# Patient Record
Sex: Female | Born: 1945 | Race: Black or African American | Hispanic: No | Marital: Single | Smoking: Never smoker
Health system: Southern US, Community
[De-identification: ages and names within clinical notes are randomized; demographics above are authoritative.]

## PROBLEM LIST (undated history)

## (undated) DIAGNOSIS — F0390 Unspecified dementia without behavioral disturbance: Secondary | ICD-10-CM

---

## 2019-03-07 ENCOUNTER — Emergency Department (HOSPITAL_COMMUNITY): Payer: Medicare Other

## 2019-03-07 ENCOUNTER — Encounter (HOSPITAL_COMMUNITY): Payer: Self-pay | Admitting: Emergency Medicine

## 2019-03-07 ENCOUNTER — Other Ambulatory Visit: Payer: Self-pay

## 2019-03-07 ENCOUNTER — Inpatient Hospital Stay (HOSPITAL_COMMUNITY)
Admission: EM | Admit: 2019-03-07 | Discharge: 2019-03-12 | DRG: 177 | Disposition: A | Discharge status: Skilled Nursing Facility | Payer: Medicare Other | Attending: Internal Medicine | Admitting: Internal Medicine

## 2019-03-07 DIAGNOSIS — U071 COVID-19: Principal | ICD-10-CM | POA: Diagnosis present

## 2019-03-07 DIAGNOSIS — J1282 Pneumonia due to coronavirus disease 2019: Secondary | ICD-10-CM | POA: Diagnosis present

## 2019-03-07 DIAGNOSIS — F0390 Unspecified dementia without behavioral disturbance: Secondary | ICD-10-CM | POA: Diagnosis present

## 2019-03-07 DIAGNOSIS — R531 Weakness: Secondary | ICD-10-CM | POA: Diagnosis not present

## 2019-03-07 DIAGNOSIS — G9341 Metabolic encephalopathy: Secondary | ICD-10-CM | POA: Diagnosis present

## 2019-03-07 DIAGNOSIS — G9340 Encephalopathy, unspecified: Secondary | ICD-10-CM | POA: Diagnosis present

## 2019-03-07 DIAGNOSIS — E860 Dehydration: Secondary | ICD-10-CM | POA: Diagnosis present

## 2019-03-07 HISTORY — DX: Unspecified dementia, unspecified severity, without behavioral disturbance, psychotic disturbance, mood disturbance, and anxiety: F03.90

## 2019-03-07 LAB — BASIC METABOLIC PANEL
Anion gap: 10 (ref 5–15)
BUN: 27 mg/dL — ABNORMAL HIGH (ref 8–23)
CO2: 22 mmol/L (ref 22–32)
Calcium: 8.5 mg/dL — ABNORMAL LOW (ref 8.9–10.3)
Chloride: 106 mmol/L (ref 98–111)
Creatinine, Ser: 1.19 mg/dL — ABNORMAL HIGH (ref 0.44–1.00)
GFR calc Af Amer: 53 mL/min — ABNORMAL LOW (ref >60)
GFR calc non Af Amer: 46 mL/min — ABNORMAL LOW (ref >60)
Glucose, Bld: 150 mg/dL — ABNORMAL HIGH (ref 70–99)
Potassium: 3.9 mmol/L (ref 3.5–5.1)
Sodium: 138 mmol/L (ref 135–145)

## 2019-03-07 LAB — LIPASE, BLOOD: Lipase: 68 U/L — ABNORMAL HIGH (ref 11–51)

## 2019-03-07 LAB — CBC
HCT: 43.4 % (ref 36.0–46.0)
Hemoglobin: 14.3 g/dL (ref 12.0–15.0)
MCH: 27.9 pg (ref 26.0–34.0)
MCHC: 32.9 g/dL (ref 30.0–36.0)
MCV: 84.8 fL (ref 80.0–100.0)
Platelets: 207 10*3/uL (ref 150–400)
RBC: 5.12 MIL/uL — ABNORMAL HIGH (ref 3.87–5.11)
RDW: 13.2 % (ref 11.5–15.5)
WBC: 9.7 10*3/uL (ref 4.0–10.5)
nRBC: 0 % (ref 0.0–0.2)

## 2019-03-07 LAB — HEPATIC FUNCTION PANEL
ALT: 28 U/L (ref 0–44)
AST: 48 U/L — ABNORMAL HIGH (ref 15–41)
Albumin: 3.4 g/dL — ABNORMAL LOW (ref 3.5–5.0)
Alkaline Phosphatase: 44 U/L (ref 38–126)
Bilirubin, Direct: 0.5 mg/dL — ABNORMAL HIGH (ref 0.0–0.2)
Indirect Bilirubin: 0.8 mg/dL (ref 0.3–0.9)
Total Bilirubin: 1.3 mg/dL — ABNORMAL HIGH (ref 0.3–1.2)
Total Protein: 7.5 g/dL (ref 6.5–8.1)

## 2019-03-07 LAB — URINALYSIS, ROUTINE W REFLEX MICROSCOPIC
Bacteria, UA: NONE SEEN
Bilirubin Urine: NEGATIVE
Glucose, UA: NEGATIVE mg/dL
Ketones, ur: 5 mg/dL — AB
Leukocytes,Ua: NEGATIVE
Nitrite: NEGATIVE
Protein, ur: 100 mg/dL — AB
Specific Gravity, Urine: 1.03 (ref 1.005–1.030)
pH: 5 (ref 5.0–8.0)

## 2019-03-07 LAB — LACTIC ACID, PLASMA: Lactic Acid, Venous: 2.1 mmol/L (ref 0.5–1.9)

## 2019-03-07 MED ORDER — SODIUM CHLORIDE 0.9% FLUSH: 3.0000 mL | Freq: Once | INTRAVENOUS | Status: DC

## 2019-03-07 NOTE — ED Triage Notes (Signed)
Per pt family, pt was brought here from [place] today, they found that she is weak, will not eat or drink.  Hx of Dementia, no neuro defects but is weak.  Pt not able to ambulate

## 2019-03-07 NOTE — ED Provider Notes (Signed)
Medical screening examination/treatment/procedure(s) were conducted as a shared visit with non-physician practitioner(s) and myself.  I personally evaluated the patient during the encounter.  EKG Interpretation  Date/Time:  Tuesday March 07 2019 20:05:38 EST Ventricular Rate:  92 PR Interval:  112 QRS Duration: 82 QT Interval:  364 QTC Calculation: 450 R Axis:   81 Text Interpretation: Normal sinus rhythm Right atrial enlargement Nonspecific ST abnormality Abnormal ECG Confirmed by Lorre Nick ([id number]) on 03/07/2019 10:68:8 PM  74 year old patient with history of dementia presents with increasing altered mental status.  Low-grade temperature here and x-ray consistent with possible infection.  Will check urine.  Also obtain head CT and admit for further management   Lorre Nick, MD 03/07/19 2316

## 2019-03-07 NOTE — ED Provider Notes (Signed)
[facility] EMERGENCY DEPARTMENT Provider Note   CSN: [id number] Arrival date & time: 03/07/19  1907   History Chief Complaint  Patient presents with  . Weakness    Yesenia Copeland is a 75 y.o. female who presents with weakness. Pt has dementia and is a poor historian. Her sister is at bedside and helps with history. She states that the patient lives in [place] but lives alone and usually has family coming over to help her. Over the past 3 days she has been more confused, weak, and not eating and drinking. She has been complaining of a lot of body pain in her back and legs. She does not have problems with pain typically and is ambulatory. Her sister went to [place] and brought her back here because this is where she lives. She has not noted a fever, cough, N/V/D. She has had urinary incontinence. The patient states she feels fine.  LEVEL 5 caveat due to dementia.  HPI     History reviewed. No pertinent past medical history.  There are no problems to display for this patient.   History reviewed. No pertinent surgical history.   OB History   No obstetric history on file.     No family history on file.  Social History   Tobacco Use  . Smoking status: Not on file  Substance Use Topics  . Alcohol use: Not on file  . Drug use: Not on file    Home Medications Prior to Admission medications   Not on File    Allergies    Patient has no known allergies.  Review of Systems   Review of Systems  Unable to perform ROS: Dementia    Physical Exam Updated Vital Signs BP 97/66 (BP Location: Right Arm)   Pulse 94   Temp 99.6 F (37.6 C) (Oral)   Resp 16   Ht 5\' 5"  (1.651 m)   SpO2 91%   Physical Exam Vitals and nursing note reviewed.  Constitutional:      General: She is not in acute distress.    Appearance: Normal appearance. She is well-developed, well-groomed and underweight. She is not ill-appearing.     Comments: Thin, calm and cooperative  HENT:      Head: Normocephalic and atraumatic.     Mouth/Throat:     Mouth: Mucous membranes are dry.  Eyes:     General: No scleral icterus.       Right eye: No discharge.        Left eye: No discharge.     Conjunctiva/sclera: Conjunctivae normal.     Pupils: Pupils are equal, round, and reactive to light.  Cardiovascular:     Rate and Rhythm: Normal rate and regular rhythm.  Pulmonary:     Effort: Pulmonary effort is normal. No respiratory distress.     Breath sounds: Normal breath sounds.  Abdominal:     General: There is no distension.     Palpations: Abdomen is soft.     Tenderness: There is no abdominal tenderness.  Musculoskeletal:     Cervical back: Normal range of motion.     Comments: No spinal tenderness or focal leg tenderness but has pain with movement in the bed  Skin:    General: Skin is warm and dry.  Neurological:     Mental Status: She is alert. She is confused.  Psychiatric:        Attention and Perception: Attention normal.        Mood  and Affect: Mood normal.        Behavior: Behavior normal. Behavior is cooperative.     ED Results / Procedures / Treatments   Labs (all labs ordered are listed, but only abnormal results are displayed) Labs Reviewed  BASIC METABOLIC PANEL - Abnormal; Notable for the following components:      Result Value   Glucose, Bld 150 (*)    BUN 27 (*)    Creatinine, Ser 1.19 (*)    Calcium 8.5 (*)    GFR calc non Af Amer 46 (*)    GFR calc Af Amer 53 (*)    All other components within normal limits  CBC - Abnormal; Notable for the following components:   RBC 5.12 (*)    All other components within normal limits  URINALYSIS, ROUTINE W REFLEX MICROSCOPIC - Abnormal; Notable for the following components:   Color, Urine AMBER (*)    APPearance HAZY (*)    Hgb urine dipstick MODERATE (*)    Ketones, ur 5 (*)    Protein, ur 100 (*)    All other components within normal limits  HEPATIC FUNCTION PANEL - Abnormal; Notable for the  following components:   Albumin 3.4 (*)    AST 48 (*)    Total Bilirubin 1.3 (*)    Bilirubin, Direct 0.5 (*)    All other components within normal limits  LIPASE, BLOOD - Abnormal; Notable for the following components:   Lipase 68 (*)    All other components within normal limits  LACTIC ACID, PLASMA - Abnormal; Notable for the following components:   Lactic Acid, Venous 2.1 (*)    All other components within normal limits  POC SARS CORONAVIRUS 2 AG -  ED - Abnormal; Notable for the following components:   SARS Coronavirus 2 Ag POSITIVE (*)    All other components within normal limits  CULTURE, BLOOD (ROUTINE X 2)  CULTURE, BLOOD (ROUTINE X 2)  LACTIC ACID, PLASMA  D-DIMER, QUANTITATIVE (NOT AT [facility])  PROCALCITONIN  LACTATE DEHYDROGENASE  FERRITIN  TRIGLYCERIDES  FIBRINOGEN  C-REACTIVE PROTEIN  CBG MONITORING, ED    EKG EKG Interpretation  Date/Time:  Tuesday March 07 2019 20:05:38 EST Ventricular Rate:  92 PR Interval:  112 QRS Duration: 82 QT Interval:  364 QTC Calculation: 450 R Axis:   81 Text Interpretation: Normal sinus rhythm Right atrial enlargement Nonspecific ST abnormality Abnormal ECG Confirmed by Lorre Nick ([id number]) on 03/07/2019 10:32:51 PM   Radiology DG Chest Port 1 View  Result Date: 03/07/2019 CLINICAL DATA:  Weakness and fatigue EXAM: PORTABLE CHEST 1 VIEW COMPARISON:  None. FINDINGS: No consolidation or effusion. Possible subtle ground-glass opacity in the left mid lung. Mild bronchitic changes. Normal heart size. Aortic atherosclerosis. No pneumothorax. IMPRESSION: Possible subtle ground-glass and interstitial opacity in the left mid lung, questionable for mild pneumonia. Electronically Signed   By: Jasmine Pang M.D.   On: 03/07/2019 22:46    Procedures Procedures (including critical care time)  Medications Ordered in ED Medications  sodium chloride flush (NS) 0.9 % injection 3 mL (has no administration in time range)  sodium chloride  0.9 % bolus 1,000 mL (has no administration in time range)    ED Course  I have reviewed the triage vital signs and the nursing notes.  Pertinent labs & imaging results that were available during my care of the patient were reviewed by me and considered in my medical decision making (see chart for details).  74 year old  female presents with worsening confusion, generalized weakness, body aches, and poor PO intake for 3 days. BP is initially soft here and rectal temp is 99.5. Otherwise vitals are reassuring. On exam her mucous membranes are dry. She is confused. Heart is regular rate and rhythm. Lungs are CTA. Abdomen is soft, non-tender. She does not have any focal MSK tenderness but just generally body aching with movement. CBC is normal. CMP shows mild hyperglycemia (150) and mildly elevated BUN (27) and SCr (1.19). Lipase is minimally elevated to 68 but she has not had any abdominal pain or vomiting. UA does not show any clear signs of UTI. Lactate is 2.1. CXR shows ground glass opacity in the left lung. CT head is negative. Rapid POC COVID is positive.   Shared visit with Dr. Freida Busman. Will admit for generalized weakness, dehydration, AMS, and COVID +. Discussed with Dr. Toniann Fail who will admit.  MDM Rules/Calculators/A&P                       Final Clinical Impression(s) / ED Diagnoses Final diagnoses:  Weakness  COVID-19    Rx / DC Orders ED Discharge Orders    None       Bethel Born, PA-C 03/08/19 0104    Lorre Nick, MD 03/08/19 2248

## 2019-03-08 ENCOUNTER — Inpatient Hospital Stay (HOSPITAL_COMMUNITY): Payer: Medicare Other

## 2019-03-08 ENCOUNTER — Encounter (HOSPITAL_COMMUNITY): Payer: Self-pay | Admitting: Internal Medicine

## 2019-03-08 DIAGNOSIS — R4182 Altered mental status, unspecified: Secondary | ICD-10-CM | POA: Diagnosis not present

## 2019-03-08 DIAGNOSIS — U071 COVID-19: Secondary | ICD-10-CM | POA: Diagnosis present

## 2019-03-08 DIAGNOSIS — R531 Weakness: Secondary | ICD-10-CM | POA: Diagnosis present

## 2019-03-08 DIAGNOSIS — G9341 Metabolic encephalopathy: Secondary | ICD-10-CM | POA: Diagnosis present

## 2019-03-08 DIAGNOSIS — G9340 Encephalopathy, unspecified: Secondary | ICD-10-CM | POA: Diagnosis present

## 2019-03-08 DIAGNOSIS — J1282 Pneumonia due to coronavirus disease 2019: Secondary | ICD-10-CM

## 2019-03-08 DIAGNOSIS — F0390 Unspecified dementia without behavioral disturbance: Secondary | ICD-10-CM | POA: Diagnosis present

## 2019-03-08 DIAGNOSIS — E860 Dehydration: Secondary | ICD-10-CM | POA: Diagnosis present

## 2019-03-08 LAB — CBC WITH DIFFERENTIAL/PLATELET
Abs Immature Granulocytes: 0.05 10*3/uL (ref 0.00–0.07)
Basophils Absolute: 0 10*3/uL (ref 0.0–0.1)
Basophils Relative: 0 %
Eosinophils Absolute: 0 10*3/uL (ref 0.0–0.5)
Eosinophils Relative: 0 %
HCT: 37.3 % (ref 36.0–46.0)
Hemoglobin: 12.5 g/dL (ref 12.0–15.0)
Immature Granulocytes: 1 %
Lymphocytes Relative: 9 %
Lymphs Abs: 1 10*3/uL (ref 0.7–4.0)
MCH: 27.7 pg (ref 26.0–34.0)
MCHC: 33.5 g/dL (ref 30.0–36.0)
MCV: 82.7 fL (ref 80.0–100.0)
Monocytes Absolute: 0.6 10*3/uL (ref 0.1–1.0)
Monocytes Relative: 6 %
Neutro Abs: 9.3 10*3/uL — ABNORMAL HIGH (ref 1.7–7.7)
Neutrophils Relative %: 84 %
Platelets: 184 10*3/uL (ref 150–400)
RBC: 4.51 MIL/uL (ref 3.87–5.11)
RDW: 13.3 % (ref 11.5–15.5)
WBC: 11 10*3/uL — ABNORMAL HIGH (ref 4.0–10.5)
nRBC: 0 % (ref 0.0–0.2)

## 2019-03-08 LAB — AMMONIA: Ammonia: 21 umol/L (ref 9–35)

## 2019-03-08 LAB — BASIC METABOLIC PANEL
Anion gap: 9 (ref 5–15)
BUN: 22 mg/dL (ref 8–23)
CO2: 23 mmol/L (ref 22–32)
Calcium: 8.1 mg/dL — ABNORMAL LOW (ref 8.9–10.3)
Chloride: 112 mmol/L — ABNORMAL HIGH (ref 98–111)
Creatinine, Ser: 0.92 mg/dL (ref 0.44–1.00)
GFR calc Af Amer: >60 mL/min (ref >60)
GFR calc non Af Amer: >60 mL/min (ref >60)
Glucose, Bld: 125 mg/dL — ABNORMAL HIGH (ref 70–99)
Potassium: 3.6 mmol/L (ref 3.5–5.1)
Sodium: 144 mmol/L (ref 135–145)

## 2019-03-08 LAB — FERRITIN
Ferritin: 372 ng/mL — ABNORMAL HIGH (ref 11–307)
Ferritin: 301 ng/mL (ref 11–307)

## 2019-03-08 LAB — CBC
HCT: 37.2 % (ref 36.0–46.0)
Hemoglobin: 12.5 g/dL (ref 12.0–15.0)
MCH: 28 pg (ref 26.0–34.0)
MCHC: 33.6 g/dL (ref 30.0–36.0)
MCV: 83.2 fL (ref 80.0–100.0)
Platelets: 183 10*3/uL (ref 150–400)
RBC: 4.47 MIL/uL (ref 3.87–5.11)
RDW: 13.3 % (ref 11.5–15.5)
WBC: 10.1 10*3/uL (ref 4.0–10.5)
nRBC: 0 % (ref 0.0–0.2)

## 2019-03-08 LAB — HEPATIC FUNCTION PANEL
ALT: 20 U/L (ref 0–44)
AST: 31 U/L (ref 15–41)
Albumin: 2.7 g/dL — ABNORMAL LOW (ref 3.5–5.0)
Alkaline Phosphatase: 42 U/L (ref 38–126)
Bilirubin, Direct: 0.1 mg/dL (ref 0.0–0.2)
Indirect Bilirubin: 0.5 mg/dL (ref 0.3–0.9)
Total Bilirubin: 0.6 mg/dL (ref 0.3–1.2)
Total Protein: 5.8 g/dL — ABNORMAL LOW (ref 6.5–8.1)

## 2019-03-08 LAB — C-REACTIVE PROTEIN
CRP: 5.4 mg/dL — ABNORMAL HIGH (ref <1.0)
CRP: 4.6 mg/dL — ABNORMAL HIGH (ref <1.0)

## 2019-03-08 LAB — PROCALCITONIN
Procalcitonin: 0.49 ng/mL
Procalcitonin: 0.42 ng/mL

## 2019-03-08 LAB — POC SARS CORONAVIRUS 2 AG -  ED: SARS Coronavirus 2 Ag: POSITIVE — AB

## 2019-03-08 LAB — FIBRINOGEN: Fibrinogen: 628 mg/dL — ABNORMAL HIGH (ref 210–475)

## 2019-03-08 LAB — CREATININE, SERUM
Creatinine, Ser: 1.14 mg/dL — ABNORMAL HIGH (ref 0.44–1.00)
GFR calc Af Amer: 56 mL/min — ABNORMAL LOW (ref >60)
GFR calc non Af Amer: 48 mL/min — ABNORMAL LOW (ref >60)

## 2019-03-08 LAB — LACTATE DEHYDROGENASE: LDH: 306 U/L — ABNORMAL HIGH (ref 98–192)

## 2019-03-08 LAB — LACTIC ACID, PLASMA: Lactic Acid, Venous: 1.1 mmol/L (ref 0.5–1.9)

## 2019-03-08 LAB — D-DIMER, QUANTITATIVE
D-Dimer, Quant: 5.82 ug/mL-FEU — ABNORMAL HIGH (ref 0.00–0.50)
D-Dimer, Quant: 5.04 ug/mL-FEU — ABNORMAL HIGH (ref 0.00–0.50)

## 2019-03-08 LAB — TRIGLYCERIDES: Triglycerides: 94 mg/dL (ref <150)

## 2019-03-08 MED ORDER — SODIUM CHLORIDE 0.9 % IV SOLN: INTRAVENOUS | Status: AC

## 2019-03-08 MED ORDER — ACETAMINOPHEN 650 MG RE SUPP
650.0000 mg | Freq: Once | RECTAL | Status: AC
  Administered 2019-03-08: 650 mg via RECTAL
  Filled 2019-03-08: qty 1

## 2019-03-08 MED ORDER — MEMANTINE HCL ER 28 MG PO CP24
28.0000 mg | ORAL_CAPSULE | Freq: Every day | ORAL | Status: DC
  Administered 2019-03-08 – 2019-03-12 (×5): 28 mg via ORAL
  Filled 2019-03-08 (×5): qty 1

## 2019-03-08 MED ORDER — ACETAMINOPHEN 325 MG PO TABS
650.0000 mg | ORAL_TABLET | Freq: Four times a day (QID) | ORAL | Status: DC | PRN
  Administered 2019-03-09 (×2): 650 mg via ORAL
  Filled 2019-03-08 (×2): qty 2

## 2019-03-08 MED ORDER — IRBESARTAN 75 MG PO TABS
75.0000 mg | ORAL_TABLET | Freq: Every day | ORAL | Status: DC
  Administered 2019-03-08 – 2019-03-12 (×5): 75 mg via ORAL
  Filled 2019-03-08 (×5): qty 1

## 2019-03-08 MED ORDER — SODIUM CHLORIDE 0.9 % IV SOLN
200.0000 mg | Freq: Once | INTRAVENOUS | Status: AC
  Administered 2019-03-08: 200 mg via INTRAVENOUS
  Filled 2019-03-08 (×2): qty 40

## 2019-03-08 MED ORDER — LEVOCETIRIZINE DIHYDROCHLORIDE 2.5 MG/5ML PO SOLN: 2.5000 mg | Freq: Every evening | ORAL | Status: DC

## 2019-03-08 MED ORDER — ENOXAPARIN SODIUM 40 MG/0.4ML ~~LOC~~ SOLN
40.0000 mg | SUBCUTANEOUS | Status: DC
  Administered 2019-03-08 – 2019-03-10 (×3): 40 mg via SUBCUTANEOUS
  Filled 2019-03-08 (×3): qty 0.4

## 2019-03-08 MED ORDER — ONDANSETRON HCL 4 MG PO TABS: 4.0000 mg | ORAL_TABLET | Freq: Four times a day (QID) | ORAL | Status: DC | PRN

## 2019-03-08 MED ORDER — QUETIAPINE FUMARATE 25 MG PO TABS
25.0000 mg | ORAL_TABLET | Freq: Two times a day (BID) | ORAL | Status: DC | PRN
  Administered 2019-03-08: 25 mg via ORAL
  Filled 2019-03-08: qty 1

## 2019-03-08 MED ORDER — HYDRALAZINE HCL 20 MG/ML IJ SOLN: 10.0000 mg | INTRAMUSCULAR | Status: DC | PRN

## 2019-03-08 MED ORDER — SODIUM CHLORIDE 0.9 % IV SOLN
100.0000 mg | Freq: Every day | INTRAVENOUS | Status: AC
  Administered 2019-03-09 – 2019-03-12 (×4): 100 mg via INTRAVENOUS
  Filled 2019-03-08 (×4): qty 20

## 2019-03-08 MED ORDER — ONDANSETRON HCL 4 MG/2ML IJ SOLN: 4.0000 mg | Freq: Four times a day (QID) | INTRAMUSCULAR | Status: DC | PRN

## 2019-03-08 MED ORDER — LORATADINE 10 MG PO TABS
10.0000 mg | ORAL_TABLET | Freq: Every evening | ORAL | Status: DC
  Administered 2019-03-09 – 2019-03-11 (×3): 10 mg via ORAL
  Filled 2019-03-08 (×3): qty 1

## 2019-03-08 MED ORDER — SODIUM CHLORIDE 0.9 % IV BOLUS
1000.0000 mL | Freq: Once | INTRAVENOUS | Status: AC
  Administered 2019-03-08: 1000 mL via INTRAVENOUS

## 2019-03-08 MED ORDER — ACETAMINOPHEN 650 MG RE SUPP
650.0000 mg | Freq: Four times a day (QID) | RECTAL | Status: DC | PRN
  Administered 2019-03-08: 650 mg via RECTAL
  Filled 2019-03-08: qty 1

## 2019-03-08 NOTE — Progress Notes (Signed)
Pt cannot lay flat and stay still for duration of MRI per RN. Pt is in fetal position. Pt is demented and confused.

## 2019-03-08 NOTE — Progress Notes (Signed)
PROGRESS NOTE                                                                                                                                                                                                             Patient Demographics:    Yesenia Copeland, is a 74 y.o. female, DOB - [birth year], HER:[id number]  Outpatient Primary MD for the patient is Patient, No Pcp Per   Admit date - 03/07/2019   LOS - 0  Chief Complaint  Patient presents with  . Weakness       Brief Narrative: Patient is a 74 y.o. female with PMHx of dementia-who was brought to [facility] from [place] 1 day prior to this hospital admission by her sister-brought to the ED on 2/23 for worsening confusion.  She was found to have COVID-19 and admitted to the hospitalist service.   Subjective:    Yesenia Copeland today to tell me her name-and her age.  Yesenia Copeland.   Assessment  & Plan :   Covid 19 Viral pneumonia: Not hypoxic-CRP is mildly elevated-at risk for severe disease-continue remdesivir.  Fever: T-max 101.7 F  O2 requirements:  SpO2: 94 %   COVID-19 Labs: Recent Labs    03/08/19 0115 03/08/19 0844  DDIMER 5.82* 5.04*  FERRITIN 372* 301  LDH 306*  --   CRP 5.4* 4.6*    No results found for: BNP  Recent Labs  Lab 03/08/19 0115 03/08/19 0844  PROCALCITON 0.49 0.42    No results found for: SARSCOV2NAA   COVID-19 Medications: Remdesivir: 2/23>>  Prone/Incentive Spirometry: encouraged  incentive spirometry use 3-4/hour.  DVT Prophylaxis  :  Lovenox   Acute metabolic encephalopathy superimposed on advanced dementia: Pleasantly confused-expect some amount of delirium during this hospital stay.  Resume Namenda. Add as needed seroquel.  CT head negative for acute abnormalities-EEG without any seizures-awaiting MRI brain.  HTN: BP controlled-resume valsartan at a reduced dose-follow and adjust  Consults  :  None  Procedures  :   None  ABG: No results found for: PHART, PCO2ART, PO2ART, HCO3, TCO2, ACIDBASEDEF, O2SAT  Vent Settings: N/A  Condition -  Guarded  Family Communication  :  Voicemail left for sister on 2/24  Code Status :  Full Code  Diet :  Diet Order            Diet  regular Room service appropriate? Yes; Fluid consistency: Thin  Diet effective now               Disposition Plan  :  Remain hospitalized-either home with home health services or SNF when medically stable Barriers to discharge: Complete 5 days of IV Remdesivir  Antimicorbials  :    Anti-infectives (From admission, onward)   Start     Dose/Rate Route Frequency Ordered Stop   03/09/19 1000  remdesivir 100 mg in sodium chloride 0.9 % 100 mL IVPB     100 mg 200 mL/hr over 30 Minutes Intravenous Daily 03/08/19 0405 03/13/19 0959   03/08/19 0430  remdesivir 200 mg in sodium chloride 0.9% 250 mL IVPB     200 mg 580 mL/hr over 30 Minutes Intravenous Once 03/08/19 0405 03/08/19 0456      Inpatient Medications  Scheduled Meds: . enoxaparin (LOVENOX) injection  40 mg Subcutaneous Q24H  . sodium chloride flush  3 mL Intravenous Once   Continuous Infusions: . sodium chloride 75 mL/hr at 03/08/19 0425  . [START ON 03/09/2019] remdesivir 100 mg in NS 100 mL     PRN Meds:.acetaminophen **OR** acetaminophen, hydrALAZINE, ondansetron **OR** ondansetron (ZOFRAN) IV   Time Spent in minutes  25  See all Orders from today for further details   Jeoffrey Massed M.D on 03/08/2019 at 11:07 AM  To page go to [email or web address] - use universal password  [facility] -  Office  [phone]    Objective:   Vitals:   03/08/19 0530 03/08/19 0600 03/08/19 0615 03/08/19 0630  BP: (!) 148/69 (!) 151/79  136/81  Pulse: 66     Resp: (!) 23  (!) 24   Temp: 98.3 F (36.8 C)     TempSrc: Oral     SpO2: 94%     Height:        Wt Readings from Last 3 Encounters:  No data found for Wt     Intake/Output Summary (Last 24 hours) at  03/08/2019 1107 Last data filed at 03/08/2019 0600 Gross per 24 hour  Intake 1368.33 ml  Output -  Net 1368.33 ml     Physical Exam Gen Exam: Confused-speech slow but clear-not in any distress HEENT:atraumatic, normocephalic Chest: B/L clear to auscultation anteriorly CVS:S1S2 regular Abdomen:soft non tender, non distended Extremities:no edema Neurology: Difficult exam-but moving all 4 extremities. Skin: no rash   Data Review:    CBC Recent Labs  Lab 03/07/19 2004 03/08/19 0521 03/08/19 0844  WBC 9.7 10.1 11.0*  HGB 14.3 12.5 12.5  HCT 43.4 37.2 37.3  PLT 207 183 184  MCV 84.8 83.2 82.7  MCH 27.9 28.0 27.7  MCHC 32.9 33.6 33.5  RDW 13.2 13.3 13.3  LYMPHSABS  --   --  1.0  MONOABS  --   --  0.6  EOSABS  --   --  0.0  BASOSABS  --   --  0.0    Chemistries  Recent Labs  Lab 03/07/19 2004 03/07/19 2258 03/08/19 0401 03/08/19 0844  NA 138  --   --  144  K 3.9  --   --  3.6  CL 106  --   --  112*  CO2 22  --   --  23  GLUCOSE 150*  --   --  125*  BUN 27*  --   --  22  CREATININE 1.19*  --  1.14* 0.92  CALCIUM 8.5*  --   --  8.1*  AST  --  48*  --  31  ALT  --  28  --  20  ALKPHOS  --  44  --  42  BILITOT  --  1.3*  --  0.6   ------------------------------------------------------------------------------------------------------------------ Recent Labs    03/08/19 0030  TRIG 94    No results found for: HGBA1C ------------------------------------------------------------------------------------------------------------------ No results for input(s): TSH, T4TOTAL, T3FREE, THYROIDAB in the last 72 hours.  Invalid input(s): FREET3 ------------------------------------------------------------------------------------------------------------------ Recent Labs    03/08/19 0115 03/08/19 0844  FERRITIN 372* 301    Coagulation profile No results for input(s): INR, PROTIME in the last 168 hours.  Recent Labs    03/08/19 0115 03/08/19 0844  DDIMER 5.82*  5.04*    Cardiac Enzymes No results for input(s): CKMB, TROPONINI, MYOGLOBIN in the last 168 hours.  Invalid input(s): CK ------------------------------------------------------------------------------------------------------------------ No results found for: BNP  Micro Results No results found for this or any previous visit (from the past 240 hour(s)).  Radiology Reports EEG  Result Date: 03/08/2019 Charlsie Quest, MD     03/08/2019 10:27 AM Patient Name: Yesenia Copeland MRN: [id number] Epilepsy Attending: Charlsie Quest Referring Physician/Provider: Dr. Midge Minium Date: 03/08/2019 Duration: 25.36 minutes Patient history: 74 year old female presented with altered mental status in the setting of COVID-19 infection.  EEG to evaluate for seizures. Level of alertness: Lethargic AEDs during EEG study: None Technical aspects: This EEG study was done with scalp electrodes positioned according to the 10-20 International system of electrode placement. Electrical activity was acquired at a sampling rate of 500Hz  and reviewed with a high frequency filter of 70Hz  and a low frequency filter of 1Hz . EEG data were recorded continuously and digitally stored. Description: No clear posterior dominant was seen.  EEG showed continuous generalized 6 to 7 Hz theta slowing as well as intermittent generalized 2 to 3 Hz delta slowing.  Hyperventilation and photic summation were not performed. Abnormality - Continuous slow, generalized IMPRESSION: This study is suggestive of moderate diffuse encephalopathy, nonspecific etiology. No seizures or epileptiform discharges were seen throughout the recording.   CT Head Wo Contrast  Result Date: 03/07/2019 CLINICAL DATA:  Change in mental status EXAM: CT HEAD WITHOUT CONTRAST TECHNIQUE: Contiguous axial images were obtained from the base of the skull through the vertex without intravenous contrast. COMPARISON:  None. FINDINGS: Brain: No evidence of acute  territorial infarction, hemorrhage, hydrocephalus,extra-axial collection or mass lesion/mass effect. There is dilatation the ventricles and sulci consistent with age-related atrophy. Low-attenuation changes in the deep white matter consistent with small vessel ischemia. Vascular: No hyperdense vessel or unexpected calcification. Skull: The skull is intact. No fracture or focal lesion identified. Sinuses/Orbits: The visualized paranasal sinuses and mastoid air cells are clear. The orbits and globes intact. Other: None IMPRESSION: No acute intracranial abnormality. Findings consistent with age related atrophy and chronic small vessel ischemia Electronically Signed   By: M.D.   On: 03/07/2019 23:41   DG Chest Port 1 View  Result Date: 03/07/2019 CLINICAL DATA:  Weakness and fatigue EXAM: PORTABLE CHEST 1 VIEW COMPARISON:  None. FINDINGS: No consolidation or effusion. Possible subtle ground-glass opacity in the left mid lung. Mild bronchitic changes. Normal heart size. Aortic atherosclerosis. No pneumothorax. IMPRESSION: Possible subtle ground-glass and interstitial opacity in the left mid lung, questionable for mild pneumonia. Electronically Signed   By: Jonna Clark M.D.   On: 03/07/2019 22:46

## 2019-03-08 NOTE — H&P (Addendum)
History and Physical    Spring San BSJ:[id number] DOB: 03-May-1945 DOA: 03/07/2019  PCP: Patient, No Pcp Per  Patient coming from: Home.  Chief Complaint: Increasing confusion.  History obtained from patient's sister since patient has dementia and is encephalopathic.  HPI: Yesenia Copeland is a 74 y.o. female with history of advanced dementia who lives in [place] was brought to [facility] by patient's sister yesterday to move her to her house permanently was found to be increasingly confused and not herself and was brought to the ER.  Per patient's sister patient did not have any shortness of breath nausea vomiting or did not complain of any chest pain or diarrhea.  ED Course: In the ER patient is confused but responds to her name.  Patient was febrile with temperature of 101.7.  CT head is unremarkable.  Chest x-ray shows infiltrates concerning for pneumonia on the left side.  Covid test came back positive.  Lactic acid was 2.1 CRP 5.4 calcitonin 0.49 LDH 306 D-dimer 5.8 CBC largely unremarkable.  Lipase of 68 AST 48 ALT 28 creatinine 1.1 glucose 150.  Patient was not hypoxic.  Patient admitted for dehydration with acute encephalopathy and Covid pneumonia.  Review of Systems: As per HPI, rest all negative.   Past Medical History:  Diagnosis Date  . Dementia (HCC)     History reviewed. No pertinent surgical history.   reports that she has never smoked. She has never used smokeless tobacco. She reports that she does not drink alcohol. No history on file for drug.  No Known Allergies  Family History  Problem Relation Age of Onset  . Dementia Mother     Prior to Admission medications   Not on File    Physical Exam: Constitutional: Moderately built and nourished. Vitals:   03/08/19 0230 03/08/19 0300 03/08/19 0330 03/08/19 0341  BP: (!) 163/88 (!) 167/91 (!) 159/91   Pulse:      Resp:      Temp:    (!) 101.7 F (38.7 C)  TempSrc:    Oral  SpO2:    94%  Height:        Eyes: Anicteric no pallor. ENMT: No discharge from the ears eyes nose or mouth. Neck: No mass felt.  No neck rigidity. Respiratory: No rhonchi or crepitations. Cardiovascular: S1-S2 heard. Abdomen: Soft nontender bowel sound present. Musculoskeletal: No edema. Skin: No rash. Neurologic: Alert awake responds to her name.  Otherwise does not follow commands.  Moves all extremities. Psychiatric: Responds to her name.   Labs on Admission: I have personally reviewed following labs and imaging studies  CBC: Recent Labs  Lab 03/07/19 2004  WBC 9.7  HGB 14.3  HCT 43.4  MCV 84.8  PLT 207   Basic Metabolic Panel: Recent Labs  Lab 03/07/19 2004  NA 138  K 3.9  CL 106  CO2 22  GLUCOSE 150*  BUN 27*  CREATININE 1.19*  CALCIUM 8.5*   GFR: CrCl cannot be calculated (Unknown ideal weight.). Liver Function Tests: Recent Labs  Lab 03/07/19 2258  AST 48*  ALT 28  ALKPHOS 44  BILITOT 1.3*  PROT 7.5  ALBUMIN 3.4*   Recent Labs  Lab 03/07/19 2258  LIPASE 68*   No results for input(s): AMMONIA in the last 168 hours. Coagulation Profile: No results for input(s): INR, PROTIME in the last 168 hours. Cardiac Enzymes: No results for input(s): CKTOTAL, CKMB, CKMBINDEX, TROPONINI in the last 168 hours. BNP (last 3 results) No results for input(s): PROBNP  in the last 8760 hours. HbA1C: No results for input(s): HGBA1C in the last 72 hours. CBG: No results for input(s): GLUCAP in the last 168 hours. Lipid Profile: Recent Labs    03/08/19 0030  TRIG 94   Thyroid Function Tests: No results for input(s): TSH, T4TOTAL, FREET4, T3FREE, THYROIDAB in the last 72 hours. Anemia Panel: Recent Labs    03/08/19 0115  FERRITIN 372*   Urine analysis:    Component Value Date/Time   COLORURINE AMBER (A) 03/07/2019 2309   APPEARANCEUR HAZY (A) 03/07/2019 2309   LABSPEC 1.030 03/07/2019 2309   PHURINE 5.0 03/07/2019 2309   GLUCOSEU NEGATIVE 03/07/2019 2309   HGBUR MODERATE  (A) 03/07/2019 2309   BILIRUBINUR NEGATIVE 03/07/2019 2309   KETONESUR 5 (A) 03/07/2019 2309   PROTEINUR 100 (A) 03/07/2019 2309   NITRITE NEGATIVE 03/07/2019 2309   LEUKOCYTESUR NEGATIVE 03/07/2019 2309   Sepsis Labs: @LABRCNTIP (procalcitonin:4,lacticidven:4) )No results found for this or any previous visit (from the past 240 hour(s)).   Radiological Exams on Admission: CT Head Wo Contrast  Result Date: 03/07/2019 CLINICAL DATA:  Change in mental status EXAM: CT HEAD WITHOUT CONTRAST TECHNIQUE: Contiguous axial images were obtained from the base of the skull through the vertex without intravenous contrast. COMPARISON:  None. FINDINGS: Brain: No evidence of acute territorial infarction, hemorrhage, hydrocephalus,extra-axial collection or mass lesion/mass effect. There is dilatation the ventricles and sulci consistent with age-related atrophy. Low-attenuation changes in the deep white matter consistent with small vessel ischemia. Vascular: No hyperdense vessel or unexpected calcification. Skull: The skull is intact. No fracture or focal lesion identified. Sinuses/Orbits: The visualized paranasal sinuses and mastoid air cells are clear. The orbits and globes intact. Other: None IMPRESSION: No acute intracranial abnormality. Findings consistent with age related atrophy and chronic small vessel ischemia Electronically Signed   By: Prudencio Pair M.D.   On: 03/07/2019 23:41   DG Chest Port 1 View  Result Date: 03/07/2019 CLINICAL DATA:  Weakness and fatigue EXAM: PORTABLE CHEST 1 VIEW COMPARISON:  None. FINDINGS: No consolidation or effusion. Possible subtle ground-glass opacity in the left mid lung. Mild bronchitic changes. Normal heart size. Aortic atherosclerosis. No pneumothorax. IMPRESSION: Possible subtle ground-glass and interstitial opacity in the left mid lung, questionable for mild pneumonia. Electronically Signed   By: Donavan Foil M.D.   On: 03/07/2019 22:46    EKG: Independently reviewed.   Normal sinus rhythm with nonspecific T changes.  Assessment/Plan Principal Problem:   Acute encephalopathy Active Problems:   Pneumonia due to COVID-19 virus   Dementia ([place])    1. Acute encephalopathy in the setting of fever with Covid infection likely causing the confusion.  We will get MRI brain and EEG.  I do not think patient has any active seizures since patient is responding to her name.  Check ammonia levels. 2. COVID-19 pneumonia for which I have started patient on IV remdesivir.  Patient is not hypoxic so I did not start Decadron.  Closely monitor respiratory status and inflammatory markers. 3. Elevated lactic acid level could be from dehydration for which we will keep patient on gentle hydration follow lactic acid level blood cultures procalcitonin. 4. Elevated blood pressure reading will closely monitor blood pressure trends as needed IV hydralazine for systolic blood pressure more than 160. 5. Dementia -will need to verify home medications.  Given the patient has acute encephalopathy in the setting of Covid pneumonia will need close monitoring for any further deterioration and will need inpatient status.   DVT prophylaxis:  Lovenox. Code Status: Full code as confirmed with patient's sister. Family Communication: Patient's sister. Disposition Plan: To be determined. Consults called: None. Admission status: Inpatient.   Eduard Clos MD [facility] Pager [phone].  If 7PM-7AM, please contact night-coverage [email or web address] Password [facility]  03/08/2019, 3:55 AM

## 2019-03-08 NOTE — Procedures (Signed)
Patient Name: Yesenia Copeland  MRN: [id number]  Epilepsy Attending: Charlsie Quest  Referring Physician/Provider: Dr. Midge Minium Date: 03/08/2019 Duration: 25.36 minutes  Patient history: 74 year old female presented with altered mental status in the setting of COVID-19 infection.  EEG to evaluate for seizures.  Level of alertness: Lethargic  AEDs during EEG study: None  Technical aspects: This EEG study was done with scalp electrodes positioned according to the 10-20 International system of electrode placement. Electrical activity was acquired at a sampling rate of 500Hz  and reviewed with a high frequency filter of 70Hz  and a low frequency filter of 1Hz . EEG data were recorded continuously and digitally stored.   Description: No clear posterior dominant was seen.  EEG showed continuous generalized 6 to 7 Hz theta slowing as well as intermittent generalized 2 to 3 Hz delta slowing.  Hyperventilation and photic summation were not performed.  Abnormality - Continuous slow, generalized  IMPRESSION: This study is suggestive of moderate diffuse encephalopathy, nonspecific etiology. No seizures or epileptiform discharges were seen throughout the recording.  Jamaine Quintin 

## 2019-03-08 NOTE — Progress Notes (Signed)
EEG complete - results pending 

## 2019-03-08 NOTE — Progress Notes (Addendum)
Occupational Therapy Evaluation Patient Details Name: Yesenia Copeland MRN: [id number] DOB: Jun 05, 1945 Today's Date: 03/08/2019    History of Present Illness Patient is a 74 y.o. female with PMHx of dementia-who was brought to [place] from [place] 1 day prior to this hospital admission by her sister-brought to the ED on 2/23 for worsening confusion.  She was found to have COVID-19 and admitted to the hospitalist service.   Clinical Impression   Pt presents with increased confusion, unable to obtain full PLOF history at time of evaluation. Attempted to contact pt's sister, but unsuccessful. Pt oriented to self only at this time. Along with cognition, pt with deficits in strength, endurance, and sitting/standing balance. Pt on RA on entry with SpO2 dropping to mid-high 80s throughout session, but recovering to low 90s quickly. HR up to 135bpm with activity. Pt Mod A for washing face and Max A for lip moisturizer with difficulty sequencing and completing tasks. Pt Total A for donning socks in bed. Pt Mod A to sit EOB, Max A to return to supine. Unable to complete full sit to stand at this time, trialing HHA and RW, but pt able to demonstrate ability to scoot back in bed. Pt sat EOB for > 5 minutes, min guard to ensure maintenance of balance. Recommend SNF at DC based on current functional abilities. Will continue to follow acutely.     Follow Up Recommendations  SNF;Supervision/Assistance - 24 hour    Equipment Recommendations  Other (comment)(defer, based on progress)    Recommendations for Other Services       Precautions / Restrictions Precautions Precautions: Fall;Other (comment)(airborne) Restrictions Weight Bearing Restrictions: No      Mobility Bed Mobility Overal bed mobility: Needs Assistance Bed Mobility: Supine to Sit;Sit to Supine     Supine to sit: Mod assist Sit to supine: Max assist   General bed mobility comments: Required tactile cues and HOH for reaching bed rail,  able to assist in pushing trunk up EOB  Transfers Overall transfer level: Needs assistance               General transfer comment: unable to complete safe sit to stand at this time. Trialed HHA vs RW. Kicking B LEs out when cued to bend knees. Pt did demonstrate ability to scoot back on bed    Balance Overall balance assessment: Needs assistance Sitting-balance support: Feet supported Sitting balance-Leahy Scale: Poor     Standing balance support: Bilateral upper extremity supported Standing balance-Leahy Scale: Zero                             ADL either performed or assessed with clinical judgement   ADL Overall ADL's : Needs assistance/impaired Eating/Feeding: Minimal assistance;Bed level   Grooming: Moderate assistance;Bed level Grooming Details (indicate cue type and reason): Pt Mod A for washing face, Max A to don lip moisturizer Upper Body Bathing: Maximal assistance;Bed level   Lower Body Bathing: Total assistance;Bed level   Upper Body Dressing : Moderate assistance;Bed level   Lower Body Dressing: Total assistance;Bed level Lower Body Dressing Details (indicate cue type and reason): Pt Total A to doff/don socks in bed Toilet Transfer: +2 for physical assistance Toilet Transfer Details (indicate cue type and reason): Suspect +2, pt unable to complete at this time Toileting- Architect and Hygiene: Total assistance;Bed level       Functional mobility during ADLs: +2 for physical assistance General ADL Comments: Pt unable to sequence  tasks. Able to follow some commands, but inconsistently     Vision         Perception     Praxis      Pertinent Vitals/Pain       Hand Dominance Right   Extremity/Trunk Assessment Upper Extremity Assessment Upper Extremity Assessment: Generalized weakness   Lower Extremity Assessment Lower Extremity Assessment: Defer to PT evaluation       Communication Communication Communication:  Other (comment)(Confused, soft and occasional slurred speech)   Cognition Arousal/Alertness: Lethargic Behavior During Therapy: Restless;Anxious Overall Cognitive Status: History of cognitive impairments - at baseline                                 General Comments: Pt oriented to self only.   General Comments  Pt on RA, dropping to mid-high 80s consistently with movement and talking. HR up to 135bpm at times    Exercises     Shoulder Instructions      Home Living Family/patient expects to be discharged to:: Private residence     Type of Home: House Home Access: Stairs to enter                     Home Equipment: Walker - 2 wheels   Additional Comments: Unable to obtain full living history due to pt poor historian and unable to get in contact with pt's sister. Pt did report living in a house with stairs to enter, frequently walking and staying active (used walker in the past, but does not often use anymore), and was Independent with ADLs. unsure of history accuracy      Prior Functioning/Environment Level of Independence: Independent        Comments: Pt reports Independence. Unsure of accuracy at this time due to pt confusion        OT Problem List: Decreased strength;Decreased activity tolerance;Impaired balance (sitting and/or standing);Decreased coordination;Decreased cognition;Decreased safety awareness;Decreased knowledge of use of DME or AE      OT Treatment/Interventions: Self-care/ADL training;Therapeutic exercise;Energy conservation;DME and/or AE instruction;Therapeutic activities;Patient/family education    OT Goals(Current goals can be found in the care plan section) Acute Rehab OT Goals Patient Stated Goal: unable to state at this time OT Goal Formulation: Patient unable to participate in goal setting Time For Goal Achievement: 03/22/19 Potential to Achieve Goals: Fair  OT Frequency: Min 2X/week   Barriers to D/C:             Co-evaluation              AM-PAC OT "6 Clicks" Daily Activity     Outcome Measure Help from another person eating meals?: A Little Help from another person taking care of personal grooming?: A Lot Help from another person toileting, which includes using toliet, bedpan, or urinal?: Total Help from another person bathing (including washing, rinsing, drying)?: A Lot Help from another person to put on and taking off regular upper body clothing?: A Lot Help from another person to put on and taking off regular lower body clothing?: Total 6 Click Score: 11   End of Session Equipment Utilized During Treatment: Gait belt;Rolling walker Nurse Communication: Mobility status;Other (comment)(Vitals )  Activity Tolerance: Treatment limited secondary to medical complications (Comment);Other (comment)(limited by confusion) Patient left: in bed;with call bell/phone within reach;with bed alarm set  OT Visit Diagnosis: Unsteadiness on feet (R26.81);Muscle weakness (generalized) (M62.81);Feeding difficulties (R63.3);Other symptoms and signs involving cognitive function  Time: 4114-6431 OT Time Calculation (min): 37 min Charges:  OT General Charges $OT Visit: 1 Visit OT Evaluation $OT Eval Moderate Complexity: 1 Mod OT Treatments $Therapeutic Activity: 8-22 mins  Lorre Munroe, OTR/L  Lorre Munroe 03/08/2019, 4:14 PM

## 2019-03-08 NOTE — Progress Notes (Signed)
Patient arrived to 5W from the ED ~0415.  Alert and oriented to self.  She is confused, anxious, and forgetful but has not made attempts to leave the bed.  Is not pulling at devices or lines.  Per ED nurse report, patient was too lethargic to take oral temperature and took a rectal temp of 101.7 however it's not reported as a rectal temp in the chart.  Oral temperature taken after transfer 98.3.  Patient is tachypneic but does not require oxygen supplementation at this time.  MEWS flashed yellow for RR and temp.  Continuing to reassess.  PIV access in right forearm currently running NS @ 75.  Arrived with initial dose of remdesivir also running.  Skin assessed and intact.  Her tongue and lips are very dry; oral care performed.  Purewick in place.  Patient is on telemetry wall monitor.  Unable to orient patient as she is very forgetful and has dementia at baseline.  Bed in lowest position and bed alarms on.

## 2019-03-09 LAB — CBC
HCT: 37.4 % (ref 36.0–46.0)
Hemoglobin: 12.5 g/dL (ref 12.0–15.0)
MCH: 27.7 pg (ref 26.0–34.0)
MCHC: 33.4 g/dL (ref 30.0–36.0)
MCV: 82.7 fL (ref 80.0–100.0)
Platelets: 207 10*3/uL (ref 150–400)
RBC: 4.52 MIL/uL (ref 3.87–5.11)
RDW: 13.5 % (ref 11.5–15.5)
WBC: 7.6 10*3/uL (ref 4.0–10.5)
nRBC: 0 % (ref 0.0–0.2)

## 2019-03-09 LAB — COMPREHENSIVE METABOLIC PANEL
ALT: 19 U/L (ref 0–44)
AST: 30 U/L (ref 15–41)
Albumin: 2.4 g/dL — ABNORMAL LOW (ref 3.5–5.0)
Alkaline Phosphatase: 39 U/L (ref 38–126)
Anion gap: 11 (ref 5–15)
BUN: 25 mg/dL — ABNORMAL HIGH (ref 8–23)
CO2: 23 mmol/L (ref 22–32)
Calcium: 8.3 mg/dL — ABNORMAL LOW (ref 8.9–10.3)
Chloride: 111 mmol/L (ref 98–111)
Creatinine, Ser: 0.91 mg/dL (ref 0.44–1.00)
GFR calc Af Amer: >60 mL/min (ref >60)
GFR calc non Af Amer: >60 mL/min (ref >60)
Glucose, Bld: 103 mg/dL — ABNORMAL HIGH (ref 70–99)
Potassium: 3.7 mmol/L (ref 3.5–5.1)
Sodium: 145 mmol/L (ref 135–145)
Total Bilirubin: 0.7 mg/dL (ref 0.3–1.2)
Total Protein: 5.6 g/dL — ABNORMAL LOW (ref 6.5–8.1)

## 2019-03-09 LAB — D-DIMER, QUANTITATIVE: D-Dimer, Quant: 3.93 ug/mL-FEU — ABNORMAL HIGH (ref 0.00–0.50)

## 2019-03-09 LAB — C-REACTIVE PROTEIN: CRP: 4 mg/dL — ABNORMAL HIGH (ref <1.0)

## 2019-03-09 LAB — FERRITIN: Ferritin: 369 ng/mL — ABNORMAL HIGH (ref 11–307)

## 2019-03-09 NOTE — Progress Notes (Signed)
CSW acknowledges SNF consult. Left vm for patient's sister. If no return call by tomorrow, will send Police to address listed for a welfare check.   Osborne Casco Clemence Lengyel LCSW [phone]

## 2019-03-09 NOTE — Evaluation (Addendum)
Physical Therapy Evaluation Patient Details Name: Yesenia Copeland MRN: [id number] DOB: 05/06/1945 Today's Date: 03/09/2019   History of Present Illness  74 year old female brought to the ED 03/07/19 for worsening confusion. Patient is from [place] but sister brought her to [place] where she lives. Patient with PMH: dementia. +COVID. CXR: Possible subtle ground-glass and interstitial opacity in the left. Concern for PNA. CT head negative for acute abnormalities. EEG negative for seizures. Patient unable to complete MRI brain. Patient started on Remdesivir.    Clinical Impression  Patient confused during session, asking to go to the bathroom, needing to urinate, but has a purwick. Explained to patient but patient still with confusion. Cognition and back pain limiting patient's ability to tolerate sitting EOB. It appears patient may have chronic back pain. Patient required maxA for bed mobility. Unable to safely attempt transfer to commode. Patient declined use of bedpan. CNA present at end of session to assist patient with breakfast. Need to confirm patient's PLOF. Attempted to call patient's sister, with patient's permission, via telephone Yesenia Copeland) but went straight to voicemail. No voicemail left as no good callback number to reach this PT. Recommend continued skilled PT services and discharge to SNF for short term rehabilitation.    Follow Up Recommendations SNF    Equipment Recommendations  (TBD upon further mobility assessment)       Precautions / Restrictions Precautions Precautions: Fall;Other (comment) Precaution Comments: cognition Restrictions Weight Bearing Restrictions: No      Mobility  Bed Mobility Overal bed mobility: Needs Assistance Bed Mobility: Supine to Sit;Sit to Supine;Rolling Rolling: Max assist   Supine to sit: Max assist Sit to supine: Max assist   General bed mobility comments: HOB elevated, encouraged use of bedrail  Transfers    General transfer  comment: Unable to sit upright EOB long enough to attempt transfer     Balance Overall balance assessment: Needs assistance Sitting-balance support: Single extremity supported;Bilateral upper extremity supported;Feet supported Sitting balance-Leahy Scale: Poor Sitting balance - Comments: Leaning on R elbow due to pain (?). When assisted to upright sitting, patient resisting and leaning to right and/or posteriorly. Unable to tolerate sitting EOB Postural control: Right lateral lean;Posterior lean Standing balance support: (unable to safely attempt)         Pertinent Vitals/Pain Faces Pain Scale: Hurts even more Pain Location: back Pain Descriptors / Indicators: (patient unable to describe)    Home Living     Additional Comments: Attempted to contact patient's sister, Yesenia Copeland, listed in chart but went straight to voicemail.     Prior Function    Comments: Attempted to contact patient's sister, Yesenia Copeland, listed in chart but went straight to voicemail.         Extremity/Trunk Assessment    Cervical / Trunk Assessment Cervical / Trunk Assessment: (Patient prefers knees flexed in bed sidelying or on back)  Communication   Communication: Other (comment)(confused)  Cognition Arousal/Alertness: Awake/alert Behavior During Therapy: Restless;Anxious Overall Cognitive Status: History of cognitive impairments - at baseline Area of Impairment: Orientation;Memory;Following commands;Safety/judgement;Awareness;Attention;Problem solving  Orientation Level: Disoriented to;Place;Situation;Time   Memory: Decreased short-term memory Following Commands: Follows one step commands inconsistently Safety/Judgement: Decreased awareness of safety;Decreased awareness of deficits            Assessment/Plan    PT Assessment Patient needs continued PT services  PT Problem List Decreased strength;Decreased activity tolerance;Decreased balance;Decreased mobility;Decreased cognition;Decreased safety  awareness;Pain       PT Treatment Interventions DME instruction;Gait training;Functional mobility training;Therapeutic activities;Therapeutic exercise;Balance training;Cognitive remediation;Patient/family  education    PT Goals (Current goals can be found in the Care Plan section)  Acute Rehab PT Goals Patient Stated Goal: Patient unable to state. PT Goal Formulation: Patient unable to participate in goal setting Time For Goal Achievement: 03/22/19 Potential to Achieve Goals: Fair    Frequency Min 3X/week           AM-PAC PT "6 Clicks" Mobility  Outcome Measure Help needed turning from your back to your side while in a flat bed without using bedrails?: A Lot Help needed moving from lying on your back to sitting on the side of a flat bed without using bedrails?: A Lot Help needed moving to and from a bed to a chair (including a wheelchair)?: Total Help needed standing up from a chair using your arms (e.g., wheelchair or bedside chair)?: Total Help needed to walk in hospital room?: Total Help needed climbing 3-5 steps with a railing? : Total 6 Click Score: 8    End of Session   Activity Tolerance: Patient limited by pain(Patient limited due to cognition) Patient left: in bed;with bed alarm set;with nursing/sitter in room((CNA assisting patient with breakfast)) Nurse Communication: Mobility status(secure chat with nurse pre in person discussion post) PT Visit Diagnosis: Pain;Muscle weakness (generalized) (M62.81);Unsteadiness on feet (R26.81)    Time: 841-859 18 minutes     Charges:  1 Visit Moderate Evaluation             Birdie Hopes, DPT, PT Acute Rehab [phone]    Birdie Hopes 03/09/2019, 1:50 PM

## 2019-03-09 NOTE — Progress Notes (Signed)
PROGRESS NOTE                                                                                                                                                                                                             Yesenia Copeland Demographics:    Yesenia Copeland, is a 74 y.o. female, DOB - [birth year], IOE:[id number]  Outpatient Primary MD for the Yesenia Copeland is Yesenia Copeland, No Pcp Per   Admit date - 03/07/2019   LOS - 1  Chief Complaint  Yesenia Copeland presents with  . Weakness       Brief Narrative: Yesenia Copeland is a 74 y.o. female with PMHx of dementia-who was brought to [place] from [place] 1 day prior to this hospital admission by her sister-brought to the ED on 2/23 for worsening confusion.  She was found to have COVID-19 and admitted to the hospitalist service.   Subjective:   Lying comfortably in bed-somewhat confused-able to tell me her name her age-and her sister's name again this morning as well.   Assessment  & Plan :   Covid 19 Viral pneumonia: Room air-CRP slowly downtrending-she is at risk for severe disease-continue remdesivir.    Fever: Afebrile  O2 requirements:  SpO2: 95 %   COVID-19 Labs: Recent Labs    03/08/19 0115 03/08/19 0844 03/09/19 0503  DDIMER 5.82* 5.04* 3.93*  FERRITIN 372* 301 369*  LDH 306*  --   --   CRP 5.4* 4.6* 4.0*    No results found for: BNP  Recent Labs  Lab 03/08/19 0115 03/08/19 0844  PROCALCITON 0.49 0.42    No results found for: SARSCOV2NAA   COVID-19 Medications: Remdesivir: 2/23>>  Prone/Incentive Spirometry: encouraged  incentive spirometry use 3-4/hour.  DVT Prophylaxis  :  Lovenox   Acute metabolic encephalopathy superimposed on advanced dementia: Pleasantly confused-expect some amount of delirium during this hospital stay.  Continue Namenda and as needed Seroquel.  CT of the head negative for acute abnormalities, EEG without seizures.  Unable to do MRI brain on  2/24-as Yesenia Copeland would not cooperate-suspect would not change management anyway-hence will discontinue MRI brain.    HTN: BP controlled-continue valsartan  Consults  :  None  Procedures  :  None  ABG: No results found for: PHART, PCO2ART, PO2ART, HCO3, TCO2, ACIDBASEDEF, O2SAT  Vent Settings: N/A  Condition -  Guarded  Family Communication  :  Voicemail left for sister on 2/24,2/25  Code Status :  Full Code  Diet :  Diet Order            Diet regular Room service appropriate? Yes; Fluid consistency: Thin  Diet effective now               Disposition Plan  :  Remain hospitalized-either home with home health services or SNF when medically stable Barriers to discharge: Complete 5 days of IV Remdesivir  Antimicorbials  :    Anti-infectives (From admission, onward)   Start     Dose/Rate Route Frequency Ordered Stop   03/09/19 1000  remdesivir 100 mg in sodium chloride 0.9 % 100 mL IVPB     100 mg 200 mL/hr over 30 Minutes Intravenous Daily 03/08/19 0405 03/13/19 0959   03/08/19 0430  remdesivir 200 mg in sodium chloride 0.9% 250 mL IVPB     200 mg 580 mL/hr over 30 Minutes Intravenous Once 03/08/19 0405 03/08/19 0456      Inpatient Medications  Scheduled Meds: . enoxaparin (LOVENOX) injection  40 mg Subcutaneous Q24H  . irbesartan  75 mg Oral Daily  . loratadine  10 mg Oral QPM  . memantine  28 mg Oral Daily  . sodium chloride flush  3 mL Intravenous Once   Continuous Infusions: . remdesivir 100 mg in NS 100 mL 100 mg (03/09/19 0940)   PRN Meds:.acetaminophen **OR** acetaminophen, hydrALAZINE, ondansetron **OR** ondansetron (ZOFRAN) IV, QUEtiapine   Time Spent in minutes  25  See all Orders from today for further details   Jeoffrey Massed M.D on 03/09/2019 at 12:12 PM  To page go to [email or web address] - use universal password  [facility] -  Office  [phone]    Objective:   Vitals:   03/08/19 2200 03/09/19 0015 03/09/19 0315 03/09/19 0800  BP:  123/72  117/79 124/89  Pulse: 68 74 84 69  Resp: 20 (!) 22 (!) 23 (!) 24  Temp: 98.4 F (36.9 C) 98.9 F (37.2 C) 98 F (36.7 C) 97.6 F (36.4 C)  TempSrc: Oral Axillary Oral Oral  SpO2: 95% 96% 95% 95%  Weight:   45.9 kg   Height:        Wt Readings from Last 3 Encounters:  03/09/19 45.9 kg     Intake/Output Summary (Last 24 hours) at 03/09/2019 1212 Last data filed at 03/09/2019 0900 Gross per 24 hour  Intake 716 ml  Output 150 ml  Net 566 ml     Physical Exam Gen Exam: Confused-but not in any distress. HEENT:atraumatic, normocephalic Chest: B/L clear to auscultation anteriorly CVS:S1S2 regular Abdomen:soft non tender, non distended Extremities:no edema Neurology: Non focal Skin: no rash   Data Review:    CBC Recent Labs  Lab 03/07/19 2004 03/08/19 0521 03/08/19 0844 03/09/19 0503  WBC 9.7 10.1 11.0* 7.6  HGB 14.3 12.5 12.5 12.5  HCT 43.4 37.2 37.3 37.4  PLT 207 183 184 207  MCV 84.8 83.2 82.7 82.7  MCH 27.9 28.0 27.7 27.7  MCHC 32.9 33.6 33.5 33.4  RDW 13.2 13.3 13.3 13.5  LYMPHSABS  --   --  1.0  --   MONOABS  --   --  0.6  --   EOSABS  --   --  0.0  --   BASOSABS  --   --  0.0  --     Chemistries  Recent Labs  Lab 03/07/19 2004 03/07/19 2258 03/08/19 0401 03/08/19 0844 03/09/19 0503  NA  138  --   --  144 145  K 3.9  --   --  3.6 3.7  CL 106  --   --  112* 111  CO2 22  --   --  23 23  GLUCOSE 150*  --   --  125* 103*  BUN 27*  --   --  22 25*  CREATININE 1.19*  --  1.14* 0.92 0.91  CALCIUM 8.5*  --   --  8.1* 8.3*  AST  --  48*  --  31 30  ALT  --  28  --  20 19  ALKPHOS  --  44  --  42 39  BILITOT  --  1.3*  --  0.6 0.7   ------------------------------------------------------------------------------------------------------------------ Recent Labs    03/08/19 0030  TRIG 94    No results found for: HGBA1C ------------------------------------------------------------------------------------------------------------------ No  results for input(s): TSH, T4TOTAL, T3FREE, THYROIDAB in the last 72 hours.  Invalid input(s): FREET3 ------------------------------------------------------------------------------------------------------------------ Recent Labs    03/08/19 0844 03/09/19 0503  FERRITIN 301 369*    Coagulation profile No results for input(s): INR, PROTIME in the last 168 hours.  Recent Labs    03/08/19 0844 03/09/19 0503  DDIMER 5.04* 3.93*    Cardiac Enzymes No results for input(s): CKMB, TROPONINI, MYOGLOBIN in the last 168 hours.  Invalid input(s): CK ------------------------------------------------------------------------------------------------------------------ No results found for: BNP  Micro Results Recent Results (from the past 240 hour(s))  Blood Culture (routine x 2)     Status: None (Preliminary result)   Collection Time: 03/08/19  1:06 AM   Specimen: BLOOD LEFT FOREARM  Result Value Ref Range Status   Specimen Description BLOOD LEFT FOREARM  Final   Special Requests   Final    BOTTLES DRAWN AEROBIC AND ANAEROBIC Blood Culture results may not be optimal due to an inadequate volume of blood received in culture bottles   Culture   Final    NO GROWTH < 24 HOURS Performed at [facility], [address], [place], [place] [address]    Report Status PENDING  Incomplete  Blood Culture (routine x 2)     Status: None (Preliminary result)   Collection Time: 03/08/19  1:06 AM   Specimen: BLOOD  Result Value Ref Range Status   Specimen Description BLOOD RIGHT ANTECUBITAL  Final   Special Requests   Final    BOTTLES DRAWN AEROBIC AND ANAEROBIC Blood Culture results may not be optimal due to an inadequate volume of blood received in culture bottles   Culture   Final    NO GROWTH < 24 HOURS Performed at [facility], [address], [place], [place] [address]    Report Status PENDING  Incomplete    Radiology Reports EEG  Result Date: 03/08/2019 Charlsie Quest, MD      03/08/2019 10:27 AM Yesenia Copeland Name: Joelle Roswell MRN: [id number] Epilepsy Attending: Charlsie Quest Referring Physician/Provider: Dr. Midge Minium Date: 03/08/2019 Duration: 25.36 minutes Yesenia Copeland history: 74 year old female presented with altered mental status in the setting of COVID-19 infection.  EEG to evaluate for seizures. Level of alertness: Lethargic AEDs during EEG study: None Technical aspects: This EEG study was done with scalp electrodes positioned according to the 10-20 International system of electrode placement. Electrical activity was acquired at a sampling rate of 500Hz  and reviewed with a high frequency filter of 70Hz  and a low frequency filter of 1Hz . EEG data were recorded continuously and digitally stored. Description: No clear posterior  dominant was seen.  EEG showed continuous generalized 6 to 7 Hz theta slowing as well as intermittent generalized 2 to 3 Hz delta slowing.  Hyperventilation and photic summation were not performed. Abnormality - Continuous slow, generalized IMPRESSION: This study is suggestive of moderate diffuse encephalopathy, nonspecific etiology. No seizures or epileptiform discharges were seen throughout the recording. Lora Havens   CT Head Wo Contrast  Result Date: 03/07/2019 CLINICAL DATA:  Change in mental status EXAM: CT HEAD WITHOUT CONTRAST TECHNIQUE: Contiguous axial images were obtained from the base of the skull through the vertex without intravenous contrast. COMPARISON:  None. FINDINGS: Brain: No evidence of acute territorial infarction, hemorrhage, hydrocephalus,extra-axial collection or mass lesion/mass effect. There is dilatation the ventricles and sulci consistent with age-related atrophy. Low-attenuation changes in the deep white matter consistent with small vessel ischemia. Vascular: No hyperdense vessel or unexpected calcification. Skull: The skull is intact. No fracture or focal lesion identified. Sinuses/Orbits: The visualized paranasal  sinuses and mastoid air cells are clear. The orbits and globes intact. Other: None IMPRESSION: No acute intracranial abnormality. Findings consistent with age related atrophy and chronic small vessel ischemia Electronically Signed   By: Prudencio Pair M.D.   On: 03/07/2019 23:41   DG Chest Port 1 View  Result Date: 03/07/2019 CLINICAL DATA:  Weakness and fatigue EXAM: PORTABLE CHEST 1 VIEW COMPARISON:  None. FINDINGS: No consolidation or effusion. Possible subtle ground-glass opacity in the left mid lung. Mild bronchitic changes. Normal heart size. Aortic atherosclerosis. No pneumothorax. IMPRESSION: Possible subtle ground-glass and interstitial opacity in the left mid lung, questionable for mild pneumonia. Electronically Signed   By: Donavan Foil M.D.   On: 03/07/2019 22:46

## 2019-03-09 NOTE — Progress Notes (Signed)
I bladder scanned the patient and the scan showed a volume of . In and out cath was ordered. I went into the room to do the in and out cath and the patient then urinated. She was bladder scanned again and the bladder scan showed that there was still . In and out was done and I got out.

## 2019-03-10 LAB — COMPREHENSIVE METABOLIC PANEL
ALT: 22 U/L (ref 0–44)
AST: 34 U/L (ref 15–41)
Albumin: 2.5 g/dL — ABNORMAL LOW (ref 3.5–5.0)
Alkaline Phosphatase: 40 U/L (ref 38–126)
Anion gap: 11 (ref 5–15)
BUN: 20 mg/dL (ref 8–23)
CO2: 24 mmol/L (ref 22–32)
Calcium: 8.5 mg/dL — ABNORMAL LOW (ref 8.9–10.3)
Chloride: 104 mmol/L (ref 98–111)
Creatinine, Ser: 0.84 mg/dL (ref 0.44–1.00)
GFR calc Af Amer: >60 mL/min (ref >60)
GFR calc non Af Amer: >60 mL/min (ref >60)
Glucose, Bld: 110 mg/dL — ABNORMAL HIGH (ref 70–99)
Potassium: 3.2 mmol/L — ABNORMAL LOW (ref 3.5–5.1)
Sodium: 139 mmol/L (ref 135–145)
Total Bilirubin: 0.7 mg/dL (ref 0.3–1.2)
Total Protein: 5.6 g/dL — ABNORMAL LOW (ref 6.5–8.1)

## 2019-03-10 LAB — CBC
HCT: 39.8 % (ref 36.0–46.0)
Hemoglobin: 13.3 g/dL (ref 12.0–15.0)
MCH: 27.5 pg (ref 26.0–34.0)
MCHC: 33.4 g/dL (ref 30.0–36.0)
MCV: 82.2 fL (ref 80.0–100.0)
Platelets: 243 10*3/uL (ref 150–400)
RBC: 4.84 MIL/uL (ref 3.87–5.11)
RDW: 13.2 % (ref 11.5–15.5)
WBC: 7.7 10*3/uL (ref 4.0–10.5)
nRBC: 0 % (ref 0.0–0.2)

## 2019-03-10 LAB — D-DIMER, QUANTITATIVE: D-Dimer, Quant: 2.77 ug/mL-FEU — ABNORMAL HIGH (ref 0.00–0.50)

## 2019-03-10 LAB — C-REACTIVE PROTEIN: CRP: 3.2 mg/dL — ABNORMAL HIGH

## 2019-03-10 LAB — FERRITIN: Ferritin: 411 ng/mL — ABNORMAL HIGH (ref 11–307)

## 2019-03-10 MED ORDER — POTASSIUM CHLORIDE CRYS ER 20 MEQ PO TBCR
40.0000 meq | EXTENDED_RELEASE_TABLET | Freq: Once | ORAL | Status: AC
  Administered 2019-03-10: 40 meq via ORAL
  Filled 2019-03-10: qty 2

## 2019-03-10 NOTE — TOC Initial Note (Signed)
Transition of Care [facility]) - Initial/Assessment Note    Patient Details  Name: Yesenia Copeland MRN: [id number] Date of Birth: 1945-08-29  [facility] CM/SW Contact:    Mearl Latin, LCSW Phone Number: 03/10/2019, 9:21 AM  Clinical Narrative:                 CSW received consult for possible SNF placement at time of discharge. CSW spoke with patient's sister regarding PT recommendation of SNF placement at time of discharge. Patient's sister reported that patient resided at home alone in [place] but was beginning to feel weaker, so [name] picked her up and brought her straight to [facility]. She expressed understanding of PT recommendation and is agreeable to SNF placement at time of discharge. Patient reports preference for [facility] as it is the closest and she is familiar with it. CSW discussed insurance authorization process and provided Medicare SNF ratings list. Patient's sister expressed being hopeful for rehab and to feel better soon. No further questions reported at this time. CSW to continue to follow and assist with discharge planning needs.   Expected Discharge Plan: Skilled Nursing Facility Barriers to Discharge: Continued Medical Work up   Patient Goals and CMS Choice Patient states their goals for this hospitalization and ongoing recovery are:: Rehab CMS Medicare.gov Compare Post Acute Care list provided to:: Patient Represenative (must comment)(Sister) Choice offered to / list presented to : Sibling  Expected Discharge Plan and Services Expected Discharge Plan: Skilled Nursing Facility In-house Referral: Clinical Social Work   Post Acute Care Choice: Skilled Nursing Facility Living arrangements for the past 2 months: Single Family Home                                      Prior Living Arrangements/Services Living arrangements for the past 2 months: Single Family Home Lives with:: Siblings Patient language and need for interpreter reviewed:: Yes Do  you feel safe going back to the place where you live?: Yes      Need for Family Participation in Patient Care: Yes (Comment) Care giver support system in place?: Yes (comment)   Criminal Activity/Legal Involvement Pertinent to Current Situation/Hospitalization: No - Comment as needed  Activities of Daily Living      Permission Sought/Granted Permission sought to share information with : Facility Medical sales representative, Family Supports Permission granted to share information with : Yes, Verbal Permission Granted  Share Information with NAME: Hollie Beach  Permission granted to share info w AGENCY: SNFs  Permission granted to share info w Relationship: Sister  Permission granted to share info w Contact Information: [phone]  Emotional Assessment   Attitude/Demeanor/Rapport: Unable to Assess Affect (typically observed): Unable to Assess Orientation: : Oriented to Self Alcohol / Substance Use: Not Applicable Psych Involvement: No (comment)  Admission diagnosis:  Weakness [R53.1] Acute encephalopathy [G93.40] COVID-19 [U07.1] Patient Active Problem List   Diagnosis Date Noted  . Acute encephalopathy 03/08/2019  . Pneumonia due to COVID-19 virus 03/08/2019  . Dementia (HCC) 03/08/2019   PCP:  Patient, No Pcp Per Pharmacy:   [id number] - [place] - [address] [place] [address] Phone: [phone] Fax: [phone]     Social Determinants of Health (SDOH) Interventions    Readmission Risk Interventions No flowsheet data found.

## 2019-03-10 NOTE — NC FL2 (Signed)
[place] LEVEL OF CARE SCREENING TOOL     IDENTIFICATION  Patient Name: Yesenia Copeland Birthdate: January 11, 1946 Sex: female Admission Date (Current Location): 03/07/2019  [facility] and [place] Number:  Herbalist and Address:  [facility], [place] [address] [place], [place] [address]      Provider Number: [id number]  Attending Physician Name and Address:  Jonetta Osgood, MD  Relative Name and Phone Number:  Lawson Fiscal [phone]    Current Level of Care: Hospital Recommended Level of Care: [place] Prior Approval Number:    Date Approved/Denied:   PASRR Number: [id number]  Discharge Plan: SNF    Current Diagnoses: Patient Active Problem List   Diagnosis Date Noted  . Acute encephalopathy 03/08/2019  . Pneumonia due to COVID-19 virus 03/08/2019  . Dementia ([place]) 03/08/2019    Orientation RESPIRATION BLADDER Height & Weight     Self  Normal Incontinent, External catheter Weight: 101 lb 3.1 oz (45.9 kg) Height:  5\' 5"  (165.1 cm)  BEHAVIORAL SYMPTOMS/MOOD NEUROLOGICAL BOWEL NUTRITION STATUS  Other (Comment)(no behavioral or mood symptoms)   Continent Diet(see dc summary)  AMBULATORY STATUS COMMUNICATION OF NEEDS Skin   Extensive Assist Verbally Normal(dry and intact)                       Personal Care Assistance Level of Assistance  Bathing, Feeding, Dressing Bathing Assistance: Maximum assistance Feeding assistance: Maximum assistance Dressing Assistance: Limited assistance     Functional Limitations Info  Sight, Hearing, Speech Sight Info: Adequate Hearing Info: Adequate Speech Info: Adequate    SPECIAL CARE FACTORS FREQUENCY  PT (By licensed PT), OT (By licensed OT)     PT Frequency: 5x OT Frequency: 4x            Contractures Contractures Info: Not present    Additional Factors Info  Code Status, Allergies, Isolation Precautions Code Status Info: FULL Allergies Info:  NKA     Isolation Precautions Info: COVID +     Current Medications (03/10/2019):  This is the current hospital active medication list Current Facility-Administered Medications  Medication Dose Route Frequency Provider Last Rate Last Admin  . acetaminophen (TYLENOL) tablet 650 mg  650 mg Oral Q6H PRN Rise Patience, MD   650 mg at 03/09/19 2201   Or  . acetaminophen (TYLENOL) suppository 650 mg  650 mg Rectal Q6H PRN Rise Patience, MD   650 mg at 03/08/19 2048  . enoxaparin (LOVENOX) injection 40 mg  40 mg Subcutaneous Q24H Rise Patience, MD   40 mg at 03/09/19 1219  . hydrALAZINE (APRESOLINE) injection 10 mg  10 mg Intravenous Q4H PRN Rise Patience, MD      . irbesartan (AVAPRO) tablet 75 mg  75 mg Oral Daily Jonetta Osgood, MD   75 mg at 03/09/19 6967  . loratadine (CLARITIN) tablet 10 mg  10 mg Oral QPM Jonetta Osgood, MD   10 mg at 03/09/19 1708  . memantine (NAMENDA XR) 24 hr capsule 28 mg  28 mg Oral Daily Jonetta Osgood, MD   28 mg at 03/09/19 8938  . ondansetron (ZOFRAN) tablet 4 mg  4 mg Oral Q6H PRN Rise Patience, MD       Or  . ondansetron [facility]) injection 4 mg  4 mg Intravenous Q6H PRN Rise Patience, MD      . potassium chloride SA (KLOR-CON) CR tablet 40 mEq  40 mEq Oral Once Maretta Bees, MD      . QUEtiapine (SEROQUEL) tablet 25 mg  25 mg Oral BID PRN Maretta Bees, MD   25 mg at 03/08/19 1304  . remdesivir 100 mg in sodium chloride 0.9 % 100 mL IVPB  100 mg Intravenous Daily Titus Mould, RPH 200 mL/hr at 03/09/19 0940 100 mg at 03/09/19 0940  . sodium chloride flush (NS) 0.9 % injection 3 mL  3 mL Intravenous Once Eduard Clos, MD         Discharge Medications: Please see discharge summary for a list of discharge medications.  Relevant Imaging Results:  Relevant Lab Results:   Additional Information SSN: [id number]  Renne Crigler Atianna Haidar, LCSW

## 2019-03-10 NOTE — Progress Notes (Signed)
PROGRESS NOTE                                                                                                                                                                                                             Patient Demographics:    Yesenia Copeland, is a 74 y.o. female, DOB - November 27, 1945, TRR:[id number]  Outpatient Primary MD for the patient is Patient, No Pcp Per   Admit date - 03/07/2019   LOS - 2  Chief Complaint  Patient presents with  . Weakness       Brief Narrative: Patient is a 74 y.o. female with PMHx of dementia-who was brought to [place] from [place] 1 day prior to this hospital admission by her sister-brought to the ED on 2/23 for worsening confusion.  She was found to have COVID-19 and admitted to the hospitalist service.   Subjective:   Pleasantly confused-able to tell me her name-age and her sister's name.   Assessment  & Plan :   Covid 19 Viral pneumonia: Stable on room air-CRP trending down-continue remdesivir.  Fever: Afebrile  O2 requirements:  SpO2: 98 %   COVID-19 Labs: Recent Labs    03/08/19 0115 03/08/19 0115 03/08/19 0844 03/09/19 0503 03/10/19 0414  DDIMER 5.82*   < > 5.04* 3.93* 2.77*  FERRITIN 372*   < > 301 369* 411*  LDH 306*  --   --   --   --   CRP 5.4*   < > 4.6* 4.0* 3.2*   < > = values in this interval not displayed.    No results found for: BNP  Recent Labs  Lab 03/08/19 0115 03/08/19 0844  PROCALCITON 0.49 0.42    No results found for: SARSCOV2NAA   COVID-19 Medications: Remdesivir: 2/23>>  Prone/Incentive Spirometry: encouraged  incentive spirometry use 3-4/hour.  DVT Prophylaxis  :  Lovenox   Acute metabolic encephalopathy superimposed on advanced dementia: Pleasantly confused-expect some amount of delirium during this hospital stay.  Continue Namenda and as needed Seroquel.  CT of the head negative for acute abnormalities, EEG without  seizures.  Unable to do MRI brain on 2/24-as patient would not cooperate-suspect would not change management anyway-hence will discontinue MRI brain.    HTN: BP controlled-continue valsartan  Consults  :  None  Procedures  :  None  ABG: No results found  for: PHART, PCO2ART, PO2ART, HCO3, TCO2, ACIDBASEDEF, O2SAT  Vent Settings: N/A  Condition -  Guarded  Family Communication  :  Voicemail left for sister on 2/24,2/25-but able to talk to her on 2/26  Code Status :  Full Code  Diet :  Diet Order            Diet regular Room service appropriate? Yes; Fluid consistency: Thin  Diet effective now               Disposition Plan  :  Remain hospitalized-SNF on Sunday when she finishes her last dose of remdesivir  Barriers to discharge: Complete 5 days of IV Remdesivir  Antimicorbials  :    Anti-infectives (From admission, onward)   Start     Dose/Rate Route Frequency Ordered Stop   03/09/19 1000  remdesivir 100 mg in sodium chloride 0.9 % 100 mL IVPB     100 mg 200 mL/hr over 30 Minutes Intravenous Daily 03/08/19 0405 03/13/19 0959   03/08/19 0430  remdesivir 200 mg in sodium chloride 0.9% 250 mL IVPB     20 0 mg 580 mL/hr over 30 Minutes Intravenous Once 03/08/19 0405 03/08/19 0456      Inpatient Medications  Scheduled Meds: . enoxaparin (LOVENOX) injection  40 mg Subcutaneous Q24H  . irbesartan  75 mg Oral Daily  . loratadine  10 mg Oral QPM  . memantine  28 mg Oral Daily  . sodium chloride flush  3 mL Intravenous Once   Continuous Infusions: . remdesivir 100 mg in NS 100 mL 100 mg (03/10/19 1000)   PRN Meds:.acetaminophen **OR** acetaminophen, hydrALAZINE, ondansetron **OR** ondansetron (ZOFRAN) IV, QUEtiapine   Time Spent in minutes  25  See all Orders from today for further details   03/12/19 M.D on 03/10/2019 at 4:14 PM  To page go to [email or web address] - use universal password  [facility] -  Office  [phone]    Objective:   Vitals:    03/10/19 0601 03/10/19 1304 03/10/19 1307 03/10/19 1545  BP: 127/81 130/86    Pulse: 86 76 (!) 103   Resp: 19 (!) 30 17   Temp: 97.9 F (36.6 C) 97.9 F (36.6 C)  98 F (36.7 C)  TempSrc: Oral Axillary  Oral  SpO2: 92% 93% 98%   Weight:      Height:        Wt Readings from Last 3 Encounters:  03/09/19 45.9 kg     Intake/Output Summary (Last 24 hours) at 03/10/2019 1614 Last data filed at 03/10/2019 1546 Gross per 24 hour  Intake 540 ml  Output -  Net 540 ml     Physical Exam Gen Exam: Pleasantly confused HEENT:atraumatic, normocephalic Chest: B/L clear to auscultation anteriorly CVS:S1S2 regular Abdomen:soft non tender, non distended Extremities:no edema Neurology: Non focal Skin: no rash   Data Review:    CBC Recent Labs  Lab 03/07/19 2004 03/08/19 0521 03/08/19 0844 03/09/19 0503 03/10/19 0414  WBC 9.7 10.1 11.0* 7.6 7.7  HGB 14.3 12.5 12.5 12.5 13.3  HCT 43.4 37.2 37.3 37.4 39.8  PLT 207 183 184 207 243  MCV 84.8 83.2 82.7 82.7 82.2  MCH 27.9 28.0 27.7 27.7 27.5  MCHC 32.9 33.6 33.5 33.4 33.4  RDW 13.2 13.3 13.3 13.5 13.2  LYMPHSABS  --   --  1.0  --   --   MONOABS  --   --  0.6  --   --   EOSABS  --   --  0.0  --   --   BASOSABS  --   --  0.0  --   --     Chemistries  Recent Labs  Lab 03/07/19 2004 03/07/19 2258 03/08/19 0401 03/08/19 0844 03/09/19 0503 03/10/19 0414  NA 138  --   --  144 145 139  K 3.9  --   --  3.6 3.7 3.2*  CL 106  --   --  112* 111 104  CO2 22  --   --  23 23 24   GLUCOSE 150*  --   --  125* 103* 110*  BUN 27*  --   --  22 25* 20  CREATININE 1.19*  --  1.14* 0.92 0.91 0.84  CALCIUM 8.5*  --   --  8.1* 8.3* 8.5*  AST  --  48*  --  31 30 34  ALT  --  28  --  20 19 22   ALKPHOS  --  44  --  42 39 40  BILITOT  --  1.3*  --  0.6 0.7 0.7   ------------------------------------------------------------------------------------------------------------------ Recent Labs    03/08/19 0030  TRIG 94    No results found  for: HGBA1C ------------------------------------------------------------------------------------------------------------------ No results for input(s): TSH, T4TOTAL, T3FREE, THYROIDAB in the last 72 hours.  Invalid input(s): FREET3 ------------------------------------------------------------------------------------------------------------------ Recent Labs    03/09/19 0503 03/10/19 0414  FERRITIN 369* 411*    Coagulation profile No results for input(s): INR, PROTIME in the last 168 hours.  Recent Labs    03/09/19 0503 03/10/19 0414  DDIMER 3.93* 2.77*    Cardiac Enzymes No results for input(s): CKMB, TROPONINI, MYOGLOBIN in the last 168 hours.  Invalid input(s): CK ------------------------------------------------------------------------------------------------------------------ No results found for: BNP  Micro Results Recent Results (from the past 240 hour(s))  Blood Culture (routine x 2)     Status: None (Preliminary result)   Collection Time: 03/08/19  1:06 AM   Specimen: BLOOD LEFT FOREARM  Result Value Ref Range Status   Specimen Description BLOOD LEFT FOREARM  Final   Special Requests   Final    BOTTLES DRAWN AEROBIC AND ANAEROBIC Blood Culture results may not be optimal due to an inadequate volume of blood received in culture bottles   Culture NO GROWTH 1 DAY  Final   Report Status PENDING  Incomplete  Blood Culture (routine x 2)     Status: None (Preliminary result)   Collection Time: 03/08/19  1:06 AM   Specimen: BLOOD  Result Value Ref Range Status   Specimen Description BLOOD RIGHT ANTECUBITAL  Final   Special Requests   Final    BOTTLES DRAWN AEROBIC AND ANAEROBIC Blood Culture results may not be optimal due to an inadequate volume of blood received in culture bottles   Culture NO GROWTH 1 DAY  Final   Report Status PENDING  Incomplete    Radiology Reports EEG  Result Date: 03/08/2019 03/10/19, MD     03/08/2019 10:27 AM Patient Name: Yesenia Copeland MRN: 03/10/2019 Epilepsy Attending: Lanier Prude Referring Physician/Provider: Dr. [id number] Date: 03/08/2019 Duration: 25.36 minutes Patient history: 74 year old female presented with altered mental status in the setting of COVID-19 infection.  EEG to evaluate for seizures. Level of alertness: Lethargic AEDs during EEG study: None Technical aspects: This EEG study was done with scalp electrodes positioned according to the 10-20 International system of electrode placement. Electrical activity was acquired at a sampling rate of 500Hz  and reviewed with a high frequency filter of 70Hz   and a low frequency filter of 1Hz . EEG data were recorded continuously and digitally stored. Description: No clear posterior dominant was seen.  EEG showed continuous generalized 6 to 7 Hz theta slowing as well as intermittent generalized 2 to 3 Hz delta slowing.  Hyperventilation and photic summation were not performed. Abnormality - Continuous slow, generalized IMPRESSION: This study is suggestive of moderate diffuse encephalopathy, nonspecific etiology. No seizures or epileptiform discharges were seen throughout the recording. Lora Havens   CT Head Wo Contrast  Result Date: 03/07/2019 CLINICAL DATA:  Change in mental status EXAM: CT HEAD WITHOUT CONTRAST TECHNIQUE: Contiguous axial images were obtained from the base of the skull through the vertex without intravenous contrast. COMPARISON:  None. FINDINGS: Brain: No evidence of acute territorial infarction, hemorrhage, hydrocephalus,extra-axial collection or mass lesion/mass effect. There is dilatation the ventricles and sulci consistent with age-related atrophy. Low-attenuation changes in the deep white matter consistent with small vessel ischemia. Vascular: No hyperdense vessel or unexpected calcification. Skull: The skull is intact. No fracture or focal lesion identified. Sinuses/Orbits: The visualized paranasal sinuses and mastoid air cells are clear. The  orbits and globes intact. Other: None IMPRESSION: No acute intracranial abnormality. Findings consistent with age related atrophy and chronic small vessel ischemia Electronically Signed   By: Prudencio Pair M.D.   On: 03/07/2019 23:41   DG Chest Port 1 View  Result Date: 03/07/2019 CLINICAL DATA:  Weakness and fatigue EXAM: PORTABLE CHEST 1 VIEW COMPARISON:  None. FINDINGS: No consolidation or effusion. Possible subtle ground-glass opacity in the left mid lung. Mild bronchitic changes. Normal heart size. Aortic atherosclerosis. No pneumothorax. IMPRESSION: Possible subtle ground-glass and interstitial opacity in the left mid lung, questionable for mild pneumonia. Electronically Signed   By: Donavan Foil M.D.   On: 03/07/2019 22:46

## 2019-03-10 NOTE — Progress Notes (Signed)
Occupational Therapy Treatment Patient Details Name: Yesenia Copeland MRN: [id number] DOB: 10/23/1945 Today's Date: 03/10/2019    History of present illness 74 year old female brought to the ED 03/07/19 for worsening confusion. Patient is from [place] but sister brought her to [place] where she lives. Patient with PMH: dementia. +COVID. CXR: Possible subtle ground-glass and interstitial opacity in the left. Concern for PNA. CT head negative for acute abnormalities. EEG negative for seizures. Patient unable to complete MRI brain. Patient started on Remdesivir.     OT comments  Pt confusion still present, but lessened from evaluation as pt able to follow some directions without cues. Pt Max A + 2 to sit EOB with unsteadiness sitting unsupported, but no major LOB. Guided pt in sit to stand with HHA at Mod A + 2 and stand pivot to recliner chair. Pt noted with cervical rotation to right side and decreased ability to scan to left side with multimodal cues, some muscle tightness noted and facial grimacing with movement. Encouraged pt to look out window to left side, but due to confusion, carryover not expected. Pt Min to Mod A for grooming task to brush hair, easily distractible. Recommendation of SNF remains appropriate. Will continue to follow acutely.    Follow Up Recommendations  SNF;Supervision/Assistance - 24 hour    Equipment Recommendations  Other (comment)    Recommendations for Other Services      Precautions / Restrictions Precautions Precautions: Fall;Other (comment) Precaution Comments: cognition Restrictions Weight Bearing Restrictions: No       Mobility Bed Mobility Overal bed mobility: Needs Assistance Bed Mobility: Supine to Sit     Supine to sit: Max assist;+2 for physical assistance;HOB elevated     General bed mobility comments: Pt with tactile cues and HOH to reach bedrail, consistent cues needed for sequencing  Transfers Overall transfer level: Needs  assistance Equipment used: 2 person hand held assist Transfers: Sit to/from [employer] Sit to Stand: Mod assist;+2 physical assistance;+2 safety/equipment Stand pivot transfers: Mod assist;+2 physical assistance       General transfer comment: sit<>stand from EOB with bed height increased with modA x 2, tremulous in standing and patient abruptly sitting on EOB with attempts to step to chair, trial 2 with sit>stand with improvement min/modA x 2 and chair right next to patient for transfer to recliner chair     Balance Overall balance assessment: Needs assistance Sitting-balance support: Feet supported Sitting balance-Leahy Scale: Fair Sitting balance - Comments: Improved ability to sit unsupported  Postural control: Right lateral lean;Posterior lean(R cervical rotation)                                 ADL either performed or assessed with clinical judgement   ADL Overall ADL's : Needs assistance/impaired     Grooming: Moderate assistance;Brushing hair;Sitting                                 General ADL Comments: Pt with improved attention to tasks during session. conversation more appropriate, but still tangential and jumbled at times     Vision       Perception     Praxis      Cognition Arousal/Alertness: Awake/alert Behavior During Therapy: Restless;Anxious Overall Cognitive Status: History of cognitive impairments - at baseline Area of Impairment: Orientation;Memory;Following commands;Safety/judgement;Awareness;Attention;Problem solving  Orientation Level: Disoriented to;Place;Situation;Time   Memory: Decreased short-term memory Following Commands: Follows one step commands inconsistently Safety/Judgement: Decreased awareness of safety;Decreased awareness of deficits     General Comments: Pt oriented to self only.        Exercises Exercises: Other exercises Other Exercises Other Exercises:  Scanning and turning head to Left   Shoulder Instructions       General Comments Pt on RA, O2 stats with difficulty reading with movement. HR up to 120s during session. Pt also noted with L inattention and inability to turn head to L, observed some muscle tightness    Pertinent Vitals/ Pain       Pain Assessment: Faces Faces Pain Scale: Hurts little more Pain Location: L side neck wth attempts at L cervical rotation Pain Intervention(s): Limited activity within patient's tolerance;Monitored during session  Home Living                                          Prior Functioning/Environment              Frequency  Min 2X/week        Progress Toward Goals  OT Goals(current goals can now be found in the care plan section)  Progress towards OT goals: Progressing toward goals  Acute Rehab OT Goals Patient Stated Goal: Patient unable to state. OT Goal Formulation: Patient unable to participate in goal setting Time For Goal Achievement: 03/22/19 Potential to Achieve Goals: Fair ADL Goals Pt Will Perform Eating: Independently;sitting Pt Will Perform Grooming: with set-up;sitting Pt Will Perform Upper Body Bathing: with supervision;sitting Pt Will Perform Lower Body Bathing: with min assist;sitting/lateral leans;sit to/from stand Pt Will Transfer to Toilet: with mod assist;stand pivot transfer;bedside commode  Plan Discharge plan remains appropriate    Co-evaluation    PT/OT/SLP Co-Evaluation/Treatment: Yes Reason for Co-Treatment: Complexity of the patient's impairments (multi-system involvement);Necessary to address cognition/behavior during functional activity PT goals addressed during session: Mobility/safety with mobility;Balance OT goals addressed during session: ADL's and self-care;Other (comment)(ADL transfers)      AM-PAC OT "6 Clicks" Daily Activity     Outcome Measure   Help from another person eating meals?: A Little Help from another  person taking care of personal grooming?: A Lot Help from another person toileting, which includes using toliet, bedpan, or urinal?: Total Help from another person bathing (including washing, rinsing, drying)?: A Lot Help from another person to put on and taking off regular upper body clothing?: A Lot Help from another person to put on and taking off regular lower body clothing?: Total 6 Click Score: 11    End of Session Equipment Utilized During Treatment: Gait belt  OT Visit Diagnosis: Unsteadiness on feet (R26.81);Muscle weakness (generalized) (M62.81);Feeding difficulties (R63.3);Other symptoms and signs involving cognitive function   Activity Tolerance Patient tolerated treatment well;Other (comment)(limited by confusion)   Patient Left in chair;with call bell/phone within reach;with chair alarm set   Nurse Communication Mobility status        Time: 3976-7341 OT Time Calculation (min): 35 min  Charges: OT General Charges $OT Visit: 1 Visit OT Treatments $Therapeutic Activity: 8-22 mins  Layla Maw, OTR/L   Layla Maw 03/10/2019, 1:27 PM

## 2019-03-10 NOTE — Progress Notes (Addendum)
PT Treatment Note  Patient continues to be confused, restless during session with lines. Appeared she was in less back pain as she was able to progress to sitting EOB with contact guard and able to stand with two person hand hold assist and transfer to chair. Too unsteady and unpredictable to try to ambulate at this time. Prior to admission, patient was living alone in [place], per SW note (who contacted patient's sister). Continued recommendation for discharge to SNF for short term rehabilitation.   03/10/19 0956  PT Visit Information  Last PT Received On 03/10/19  Assistance Needed +2  PT/OT/SLP Co-Evaluation/Treatment Yes  Reason for Co-Treatment Complexity of the patient's impairments (multi-system involvement);Necessary to address cognition/behavior during functional activity;For patient/therapist safety  PT goals addressed during session Mobility/safety with mobility;Balance  History of Present Illness 74 year old female brought to the ED 03/07/19 for worsening confusion. Patient is from [place] but sister brought her to [place] where she lives. Patient with PMH: dementia. +COVID. CXR: Possible subtle ground-glass and interstitial opacity in the left. Concern for PNA. CT head negative for acute abnormalities. EEG negative for seizures. Patient unable to complete MRI brain but per chart review would not likely change course of treatment. Patient started on Remdesivir. CRP downtrending.    Subjective Data  Subjective Patient continues to be confused.  Precautions  Precautions Fall;Other (comment)  Precaution Comments cognition  Restrictions  Weight Bearing Restrictions No  Pain Assessment  Pain Assessment Faces  Faces Pain Scale 4  Pain Location L side neck with attempts at L cervical rotation  Pain Intervention(s) Limited activity within patient's tolerance;Monitored during session;Repositioned  Cognition  Arousal/Alertness Awake/alert  Behavior During Therapy Restless;Anxious   Overall Cognitive Status History of cognitive impairments - at baseline (most likely more impaired now compared to her baseline)  Orientation Level Disoriented to;Place;Time;Situation  Following Commands Follows one step commands inconsistently  Safety/Judgement Decreased awareness of safety;Decreased awareness of deficits  General Comments Patient continues to be confused, figidity with lines during session. Per SW note who was able to contact patient's sister today, patient was living alone, independent PTA.  Bed Mobility  Overal bed mobility Needs Assistance  Bed Mobility Supine to Sit;Sit to Supine  Supine to sit Max assist;+2 for physical assistance;HOB elevated  General bed mobility comments Max cues required for sequencing and completion of task.  Transfers  Overall transfer level Needs assistance  Equipment used 2 person hand held assist  Transfers Sit to/from Stand  Sit to Stand Mod assist;+2 physical assistance;+2 safety/equipment  Stand pivot transfers Mod assist;+2 physical assistance;Min assist  General transfer comment sit<>stand from EOB with bed height increased with modA x 2, tremulous in standing and patient abruptly sitting on EOB with attempts to step to chair, trial 2 with sit>stand with improvement min/modA x 2 and chair right next to patient for transfer to recliner chair   Ambulation/Gait  General Gait Details Attempted bilat HHA but unsafe at this time due to truncal ataxia and patient abruptly sitting on EOB so no further attempts made.  Balance  Overall balance assessment Needs assistance  Sitting-balance support Feet supported  Sitting balance-Leahy Scale Poor (progressing to fair static sitting EOB)  Sitting balance - Comments maxA progressing to contact guard static sitting EOB  Postural control  (R cervical rotation )  Standing balance support Bilateral upper extremity supported  Standing balance-Leahy Scale Zero  General Comments  General comments  (skin integrity, edema, etc.) On RA, oxygen down to 88%, HR 103 bpm post  transfer to chair, otherwise stable on room air.  PT - End of Session  Equipment Utilized During Treatment Gait belt  Activity Tolerance Patient limited by pain (Limited due to cognition)  Patient left in chair;with call bell/phone within reach;with chair alarm set  Nurse Communication Mobility status   PT - Assessment/Plan  PT Plan Current plan remains appropriate  PT Visit Diagnosis Pain;Muscle weakness (generalized) (M62.81);Unsteadiness on feet (R26.81)  PT Frequency (ACUTE ONLY) Min 3X/week  Follow Up Recommendations SNF  PT equipment 3in1 (PT) (TBD upon further mobility assessment)  AM-PAC PT "6 Clicks" Mobility Outcome Measure (Version 2)  Help needed turning from your back to your side while in a flat bed without using bedrails? 2  Help needed moving from lying on your back to sitting on the side of a flat bed without using bedrails? 2  Help needed moving to and from a bed to a chair (including a wheelchair)? 2  Help needed standing up from a chair using your arms (e.g., wheelchair or bedside chair)? 2  Help needed to walk in hospital room? 1  Help needed climbing 3-5 steps with a railing?  1  6 Click Score 10  Consider Recommendation of Discharge To: CIR/SNF/LTACH  PT Goal Progression  Progress towards PT goals Progressing toward goals  PT Time Calculation  PT Start Time (ACUTE ONLY) 0956  PT Stop Time (ACUTE ONLY) 1026  PT Time Calculation (min) (ACUTE ONLY) 30 min  PT General Charges  $$ ACUTE PT VISIT 1 Visit  PT Treatments  $Therapeutic Activity 8-22 mins   Angelene Giovanni, DPT, PT Acute Rehab [phone] office

## 2019-03-11 LAB — CBC
HCT: 48.7 % — ABNORMAL HIGH (ref 36.0–46.0)
Hemoglobin: 16 g/dL — ABNORMAL HIGH (ref 12.0–15.0)
MCH: 27.2 pg (ref 26.0–34.0)
MCHC: 32.9 g/dL (ref 30.0–36.0)
MCV: 82.7 fL (ref 80.0–100.0)
Platelets: 258 10*3/uL (ref 150–400)
RBC: 5.89 MIL/uL — ABNORMAL HIGH (ref 3.87–5.11)
RDW: 13.3 % (ref 11.5–15.5)
WBC: 9.4 10*3/uL (ref 4.0–10.5)
nRBC: 0 % (ref 0.0–0.2)

## 2019-03-11 LAB — COMPREHENSIVE METABOLIC PANEL
ALT: 24 U/L (ref 0–44)
AST: 38 U/L (ref 15–41)
Albumin: 2.8 g/dL — ABNORMAL LOW (ref 3.5–5.0)
Alkaline Phosphatase: 47 U/L (ref 38–126)
Anion gap: 14 (ref 5–15)
BUN: 25 mg/dL — ABNORMAL HIGH (ref 8–23)
CO2: 21 mmol/L — ABNORMAL LOW (ref 22–32)
Calcium: 8.6 mg/dL — ABNORMAL LOW (ref 8.9–10.3)
Chloride: 106 mmol/L (ref 98–111)
Creatinine, Ser: 0.83 mg/dL (ref 0.44–1.00)
GFR calc Af Amer: >60 mL/min (ref >60)
GFR calc non Af Amer: >60 mL/min (ref >60)
Glucose, Bld: 108 mg/dL — ABNORMAL HIGH (ref 70–99)
Potassium: 3.9 mmol/L (ref 3.5–5.1)
Sodium: 141 mmol/L (ref 135–145)
Total Bilirubin: 0.9 mg/dL (ref 0.3–1.2)
Total Protein: 6.6 g/dL (ref 6.5–8.1)

## 2019-03-11 LAB — FERRITIN: Ferritin: 464 ng/mL — ABNORMAL HIGH (ref 11–307)

## 2019-03-11 LAB — D-DIMER, QUANTITATIVE: D-Dimer, Quant: 1.85 ug/mL-FEU — ABNORMAL HIGH (ref 0.00–0.50)

## 2019-03-11 LAB — C-REACTIVE PROTEIN: CRP: 2.7 mg/dL — ABNORMAL HIGH (ref <1.0)

## 2019-03-11 MED ORDER — HALOPERIDOL LACTATE 5 MG/ML IJ SOLN: 1.0000 mg | Freq: Four times a day (QID) | INTRAMUSCULAR | Status: DC | PRN

## 2019-03-11 NOTE — Progress Notes (Signed)
   03/11/19 1111  Family/Significant Other Communication  Family/Significant Other Update Called;Updated (pt's sister [name])

## 2019-03-11 NOTE — Progress Notes (Signed)
PROGRESS NOTE                                                                                                                                                                                                             Patient Demographics:    Margarethe Virgen, is a 74 y.o. female, DOB - 10/03/1945, ZOX:[id number]  Outpatient Primary MD for the patient is Patient, No Pcp Per   Admit date - 03/07/2019   LOS - 3  Chief Complaint  Patient presents with  . Weakness       Brief Narrative: Patient is a 74 y.o. female with PMHx of dementia-who was brought to [facility] from [place] 1 day prior to this hospital admission by her sister-brought to the ED on 2/23 for worsening confusion.  She was found to have COVID-19 and admitted to the hospitalist service.   Subjective:   Patient in bed, appears comfortable, denies any headache, no fever, no chest pain or pressure, no shortness of breath , no abdominal pain. No focal weakness.    Assessment  & Plan :   Covid 19 Viral pneumonia: Stable on room air-CRP trending down-continue remdesivir and complete treatment on 03/12/2019.  Fever: Afebrile  O2 requirements:  SpO2: 97 %   COVID-19 Labs: Recent Labs    03/09/19 0503 03/10/19 0414 03/11/19 0201  DDIMER 3.93* 2.77* 1.85*  FERRITIN 369* 411* 464*  CRP 4.0* 3.2* 2.7*    No results found for: BNP  Recent Labs  Lab 03/08/19 0115 03/08/19 0844  PROCALCITON 0.49 0.42    No results found for: SARSCOV2NAA   COVID-19 Medications: Remdesivir: 2/23>>  Prone/Incentive Spirometry: encouraged  incentive spirometry use 3-4/hour.  Acute metabolic encephalopathy superimposed on advanced dementia: Pleasantly confused-expect some amount of delirium during this hospital stay.  Continue Namenda and as needed Seroquel.  CT of the head negative for acute abnormalities, EEG without seizures.  Unable to do MRI brain on 2/24-as patient  would not cooperate-suspect would not change management anyway-hence will discontinue MRI brain.    HTN: BP controlled-continue valsartan  Consults  :  None  Procedures  :  None  Condition -  Fair  Family Communication  : previous MD spoke to the sister on 03/10/2019 over the phone  Code Status :  Full Code  Diet :  Diet Order  Diet regular Room service appropriate? Yes; Fluid consistency: Thin  Diet effective now               Disposition Plan  :  Remain hospitalized-SNF on Sunday when she finishes her last dose of remdesivir  Barriers to discharge: Complete 5 days of IV Remdesivir  Antimicorbials  :    Anti-infectives (From admission, onward)   Start     Dose/Rate Route Frequency Ordered Stop   03/09/19 1000  remdesivir 100 mg in sodium chloride 0.9 % 100 mL IVPB     100 mg 200 mL/hr over 30 Minutes Intravenous Daily 03/08/19 0405 03/13/19 0959   03/08/19 0430  remdesivir 200 mg in sodium chloride 0.9% 250 mL IVPB     20 0 mg 580 mL/hr over 30 Minutes Intravenous Once 03/08/19 0405 03/08/19 0456      DVT Prophylaxis  :  Lovenox   Inpatient Medications  Scheduled Meds: . enoxaparin (LOVENOX) injection  40 mg Subcutaneous Q24H  . irbesartan  75 mg Oral Daily  . loratadine  10 mg Oral QPM  . memantine  28 mg Oral Daily  . sodium chloride flush  3 mL Intravenous Once   Continuous Infusions: . remdesivir 100 mg in NS 100 mL 100 mg (03/11/19 0828)   PRN Meds:.acetaminophen **OR** acetaminophen, hydrALAZINE, ondansetron **OR** ondansetron (ZOFRAN) IV, QUEtiapine   Time Spent in minutes  25  See all Orders from today for further details   03/13/19 M.D on 03/11/2019 at 11:45 AM  To page go to [email or web address] - use universal password  [facility] -  Office  [phone]    Objective:   Vitals:   03/10/19 1600 03/11/19 0343 03/11/19 0400 03/11/19 0800  BP: 115/80   (!) 125/95  Pulse: 93  76   Resp: 20 (!) 32 20 (!) 23  Temp:   97.7  F (36.5 C)   TempSrc:   Axillary   SpO2: 92%  97%   Weight:  47.3 kg    Height:        Wt Readings from Last 3 Encounters:  03/11/19 47.3 kg     Intake/Output Summary (Last 24 hours) at 03/11/2019 1145 Last data filed at 03/11/2019 0509 Gross per 24 hour  Intake 460 ml  Output 550 ml  Net -90 ml     Physical Exam  Awake, sitting in chair in no discomfort pleasantly confused [place].AT,PERRAL Supple Neck,No JVD, No cervical lymphadenopathy appriciated.  Symmetrical Chest wall movement, Good air movement bilaterally, CTAB RRR,No Gallops, Rubs or new Murmurs, No Parasternal Heave +ve B.Sounds, Abd Soft, No tenderness, No organomegaly appriciated, No rebound - guarding or rigidity. No Cyanosis, Clubbing or edema, No new Rash or bruise     Data Review:    CBC Recent Labs  Lab 03/08/19 0521 03/08/19 0844 03/09/19 0503 03/10/19 0414 03/11/19 0201  WBC 10.1 11.0* 7.6 7.7 9.4  HGB 12.5 12.5 12.5 13.3 16.0*  HCT 37.2 37.3 37.4 39.8 48.7*  PLT 183 184 207 243 258  MCV 83.2 82.7 82.7 82.2 82.7  MCH 28.0 27.7 27.7 27.5 27.2  MCHC 33.6 33.5 33.4 33.4 32.9  RDW 13.3 13.3 13.5 13.2 13.3  LYMPHSABS  --  1.0  --   --   --   MONOABS  --  0.6  --   --   --   EOSABS  --  0.0  --   --   --   BASOSABS  --  0.0  --   --   --  Chemistries  Recent Labs  Lab 03/07/19 2004 03/07/19 2004 03/07/19 2258 03/08/19 0401 03/08/19 0844 03/09/19 0503 03/10/19 0414 03/11/19 0201  NA 138  --   --   --  144 145 139 141  K 3.9  --   --   --  3.6 3.7 3.2* 3.9  CL 106  --   --   --  112* 111 104 106  CO2 22  --   --   --  23 23 24  21*  GLUCOSE 150*  --   --   --  125* 103* 110* 108*  BUN 27*  --   --   --  22 25* 20 25*  CREATININE 1.19*   < >  --  1.14* 0.92 0.91 0.84 0.83  CALCIUM 8.5*  --   --   --  8.1* 8.3* 8.5* 8.6*  AST  --   --  48*  --  31 30 34 38  ALT  --   --  28  --  20 19 22 24   ALKPHOS  --   --  44  --  42 39 40 47  BILITOT  --   --  1.3*  --  0.6 0.7 0.7 0.9   < > =  values in this interval not displayed.   ------------------------------------------------------------------------------------------------------------------ No results for input(s): CHOL, HDL, LDLCALC, TRIG, CHOLHDL, LDLDIRECT in the last 72 hours.  No results found for: HGBA1C ------------------------------------------------------------------------------------------------------------------ No results for input(s): TSH, T4TOTAL, T3FREE, THYROIDAB in the last 72 hours.  Invalid input(s): FREET3 ------------------------------------------------------------------------------------------------------------------ Recent Labs    03/10/19 0414 03/11/19 0201  FERRITIN 411* 464*    Coagulation profile No results for input(s): INR, PROTIME in the last 168 hours.  Recent Labs    03/10/19 0414 03/11/19 0201  DDIMER 2.77* 1.85*    Cardiac Enzymes No results for input(s): CKMB, TROPONINI, MYOGLOBIN in the last 168 hours.  Invalid input(s): CK ------------------------------------------------------------------------------------------------------------------ No results found for: BNP  Micro Results Recent Results (from the past 240 hour(s))  Blood Culture (routine x 2)     Status: None (Preliminary result)   Collection Time: 03/08/19  1:06 AM   Specimen: BLOOD LEFT FOREARM  Result Value Ref Range Status   Specimen Description BLOOD LEFT FOREARM  Final   Special Requests   Final    BOTTLES DRAWN AEROBIC AND ANAEROBIC Blood Culture results may not be optimal due to an inadequate volume of blood received in culture bottles   Culture   Final    NO GROWTH 3 DAYS Performed at [facility], [place] [address] [place], [place] [address]    Report Status PENDING  Incomplete  Blood Culture (routine x 2)     Status: None (Preliminary result)   Collection Time: 03/08/19  1:06 AM   Specimen: BLOOD  Result Value Ref Range Status   Specimen Description BLOOD RIGHT ANTECUBITAL  Final   Special  Requests   Final    BOTTLES DRAWN AEROBIC AND ANAEROBIC Blood Culture results may not be optimal due to an inadequate volume of blood received in culture bottles   Culture   Final    NO GROWTH 3 DAYS Performed at [facility], [place] [address] [place], [place] [address]    Report Status PENDING  Incomplete    Radiology Reports EEG  Result Date: 03/08/2019 Lora Havens, MD     03/08/2019 10:27 AM Patient Name: Keyairra Kolinski MRN: [id number] Epilepsy Attending: Lora Havens Referring  Physician/Provider: Dr. Midge Minium Date: 03/08/2019 Duration: 25.36 minutes Patient history: 74 year old female presented with altered mental status in the setting of COVID-19 infection.  EEG to evaluate for seizures. Level of alertness: Lethargic AEDs during EEG study: None Technical aspects: This EEG study was done with scalp electrodes positioned according to the 10-20 International system of electrode placement. Electrical activity was acquired at a sampling rate of 500Hz  and reviewed with a high frequency filter of 70Hz  and a low frequency filter of 1Hz . EEG data were recorded continuously and digitally stored. Description: No clear posterior dominant was seen.  EEG showed continuous generalized 6 to 7 Hz theta slowing as well as intermittent generalized 2 to 3 Hz delta slowing.  Hyperventilation and photic summation were not performed. Abnormality - Continuous slow, generalized IMPRESSION: This study is suggestive of moderate diffuse encephalopathy, nonspecific etiology. No seizures or epileptiform discharges were seen throughout the recording.   CT Head Wo Contrast  Result Date: 03/07/2019 CLINICAL DATA:  Change in mental status EXAM: CT HEAD WITHOUT CONTRAST TECHNIQUE: Contiguous axial images were obtained from the base of the skull through the vertex without intravenous contrast. COMPARISON:  None. FINDINGS: Brain: No evidence of acute territorial infarction, hemorrhage,  hydrocephalus,extra-axial collection or mass lesion/mass effect. There is dilatation the ventricles and sulci consistent with age-related atrophy. Low-attenuation changes in the deep white matter consistent with small vessel ischemia. Vascular: No hyperdense vessel or unexpected calcification. Skull: The skull is intact. No fracture or focal lesion identified. Sinuses/Orbits: The visualized paranasal sinuses and mastoid air cells are clear. The orbits and globes intact. Other: None IMPRESSION: No acute intracranial abnormality. Findings consistent with age related atrophy and chronic small vessel ischemia Electronically Signed   By: M.D.   On: 03/07/2019 23:41   DG Chest Port 1 View  Result Date: 03/07/2019 CLINICAL DATA:  Weakness and fatigue EXAM: PORTABLE CHEST 1 VIEW COMPARISON:  None. FINDINGS: No consolidation or effusion. Possible subtle ground-glass opacity in the left mid lung. Mild bronchitic changes. Normal heart size. Aortic atherosclerosis. No pneumothorax. IMPRESSION: Possible subtle ground-glass and interstitial opacity in the left mid lung, questionable for mild pneumonia. Electronically Signed   By: Jonna Clark M.D.   On: 03/07/2019 22:46

## 2019-03-11 NOTE — Progress Notes (Signed)
Pt tolerated being up in chair for seven hours. Has had poor po intake during shift. Requires significant encouragement for any sips or bites. Will continue to monitor.

## 2019-03-12 NOTE — TOC Transition Note (Signed)
Transition of Care [facility]) - CM/SW Discharge Note   Patient Details  Name: Yesenia Copeland MRN: [id number] Date of Birth: 1945-12-02  Transition of Care [facility]) CM/SW Contact:  Doy Hutching, LCSW Phone Number: 03/12/2019, 10:01 AM   Clinical Narrative:    MD has cleared pt for discharge to [facility].  CSW called and confirmed with pt sister that she is aware of discharge, she will bring clothing to drop at facility.   Messaged RN about PT[facility] papers, await confirmation to arrange [facility].    Final next level of care: Skilled Nursing Facility Barriers to Discharge: Barriers Resolved   Patient Goals and CMS Choice Patient states their goals for this hospitalization and ongoing recovery are:: Rehab CMS Medicare.gov Compare Post Acute Care list provided to:: Patient Represenative (must comment)(pt sister) Choice offered to / list presented to : Sibling, Patient  Discharge Placement PASRR number recieved: 03/10/19            Patient chooses bed at: [facility] Patient to be transferred to facility by: [facility] Name of family member notified: pt sister Yesenia Copeland Patient and family notified of of transfer: 03/12/19  Discharge Plan and Services In-house Referral: Clinical Social Work   Post Acute Care Choice: Skilled Nursing Facility        Readmission Risk Interventions Readmission Risk Prevention Plan 03/12/2019  Post Dischage Appt Not Complete  Appt Comments plan for SNF  Medication Screening Complete  Transportation Screening Complete

## 2019-03-12 NOTE — Discharge Summary (Signed)
Yesenia Copeland YYT:[id number] DOB: [birth year] DOA: 03/07/2019  PCP: Patient, No Pcp Per  Admit date: 03/07/2019  Discharge date: 03/12/2019   Disposition:  SNF   Recommendations for Outpatient Follow-up:   Follow up with PCP in 1-2 weeks  PCP Please obtain BMP/CBC, 2 view CXR in 1week,  (see Discharge instructions)   PCP Please follow up on the following pending results:     Home Health: None  Equipment/Devices: None  Consultations: None  Discharge Condition: Fair   CODE STATUS: Full    Diet Recommendation: Heart Healthy   Diet Order            Diet - low sodium heart healthy        Diet regular Room service appropriate? Yes; Fluid consistency: Thin  Diet effective now               Chief Complaint  Patient presents with  . Weakness     Brief history of present illness from the day of admission and additional interim summary    Patient is a 74 y.o. female with PMHx of dementia-who was brought to [place] from [place] 1 day prior to this hospital admission by her sister-brought to the ED on 2/23 for worsening confusion.  She was found to have COVID-19 and admitted to the hospitalist service.                                                                 Hospital Course   Covid 19 Viral pneumonia: Stable on room air-CRP trending down-continue remdesivir and complete treatment on 03/12/2019, she is stable on room air and symptom-free will be discharged to SNF.  SpO2: 92 %  Recent Labs  Lab 03/08/19 0115 03/08/19 0844 03/09/19 0503 03/10/19 0414 03/11/19 0201  CRP 5.4* 4.6* 4.0* 3.2* 2.7*  DDIMER 5.82* 5.04* 3.93* 2.77* 1.85*  FERRITIN 372* 301 369* 411* 464*  PROCALCITON 0.49 0.42  --   --   --     Hepatic Function Latest Ref Rng & Units 03/11/2019 03/10/2019 03/09/2019  Total Protein 6.5  - 8.1 g/dL 6.6 5.6(L) 5.6(L)  Albumin 3.5 - 5.0 g/dL 2.8(L) 2.5(L) 2.4(L)  AST 15 - 41 U/L 38 34 30  ALT 0 - 44 U/L 24 22 19   Alk Phosphatase 38 - 126 U/L 47 40 39  Total Bilirubin 0.3 - 1.2 mg/dL 0.9 0.7 0.7  Bilirubin, Direct 0.0 - 0.2 mg/dL - - -     Acute metabolic encephalopathy superimposed on advanced dementia: Pleasantly confused-expect some amount of delirium during this hospital stay.  Continue Namenda and as needed Seroquel.  CT of the head negative for acute abnormalities, EEG without seizures. No focal deficits. Encephalopathy and delirium likely to continue for a few days at SNF as well in a new setting.  Of note she has advanced underlying dementia.  HTN: BP controlled-continue valsartan   Discharge diagnosis     Principal Problem:   Acute encephalopathy Active Problems:   Pneumonia due to COVID-19 virus   Dementia [facility])    Discharge instructions    Discharge Instructions    Diet - low sodium heart healthy   Complete by: As directed    Discharge instructions   Complete by: As directed    COVID-19: How to Protect Yourself and Others Know how it spreads There is currently no vaccine to prevent coronavirus disease 2019 (COVID-19). The best way to prevent illness is to avoid being exposed to this virus. The virus is thought to spread mainly from person-to-person. Between people who are in close contact with one another (within about 6 feet). Through respiratory droplets produced when an infected person coughs, sneezes or talks. These droplets can land in the mouths or noses of people who are nearby or possibly be inhaled into the lungs. COVID-19 may be spread by people who are not showing symptoms. Everyone should Clean your hands often Wash your hands often with soap and water for at least 20 seconds especially after you have been in a public place, or after blowing your nose, coughing, or sneezing. If soap and water are not readily available, use a hand  sanitizer that contains at least 60% alcohol.   Increase activity slowly   Complete by: As directed       Discharge Medications   Allergies as of 03/12/2019   No Known Allergies     Medication List    TAKE these medications   levocetirizine 2.5 MG/5ML solution Commonly known as: XYZAL Take 2.5 mg by mouth every evening.   memantine 28 MG Cp24 24 hr capsule Commonly known as: NAMENDA XR Take 28 mg by mouth daily.   valsartan 160 MG tablet Commonly known as: DIOVAN Take 160 mg by mouth daily.         Major procedures and Radiology Reports - PLEASE review detailed and final reports thoroughly  -        EEG  Result Date: 03/08/2019 Lora Havens, MD     03/08/2019 10:27 AM Patient Name: Yesenia Copeland MRN: [id number] Epilepsy Attending: Lora Havens Referring Physician/Provider: Dr. Gean Birchwood Date: 03/08/2019 Duration: 25.36 minutes Patient history: 74 year old female presented with altered mental status in the setting of COVID-19 infection.  EEG to evaluate for seizures. Level of alertness: Lethargic AEDs during EEG study: None Technical aspects: This EEG study was done with scalp electrodes positioned according to the 10-20 International system of electrode placement. Electrical activity was acquired at a sampling rate of 500Hz  and reviewed with a high frequency filter of 70Hz  and a low frequency filter of 1Hz . EEG data were recorded continuously and digitally stored. Description: No clear posterior dominant was seen.  EEG showed continuous generalized 6 to 7 Hz theta slowing as well as intermittent generalized 2 to 3 Hz delta slowing.  Hyperventilation and photic summation were not performed. Abnormality - Continuous slow, generalized IMPRESSION: This study is suggestive of moderate diffuse encephalopathy, nonspecific etiology. No seizures or epileptiform discharges were seen throughout the recording. Lora Havens   CT Head Wo Contrast  Result Date:  03/07/2019 CLINICAL DATA:  Change in mental status EXAM: CT HEAD WITHOUT CONTRAST TECHNIQUE: Contiguous axial images were obtained from the base of the skull through the vertex without intravenous contrast. COMPARISON:  None. FINDINGS: Brain: No evidence of acute territorial infarction,  hemorrhage, hydrocephalus,extra-axial collection or mass lesion/mass effect. There is dilatation the ventricles and sulci consistent with age-related atrophy. Low-attenuation changes in the deep white matter consistent with small vessel ischemia. Vascular: No hyperdense vessel or unexpected calcification. Skull: The skull is intact. No fracture or focal lesion identified. Sinuses/Orbits: The visualized paranasal sinuses and mastoid air cells are clear. The orbits and globes intact. Other: None IMPRESSION: No acute intracranial abnormality. Findings consistent with age related atrophy and chronic small vessel ischemia Electronically Signed   By: Prudencio Pair M.D.   On: 03/07/2019 23:41   DG Chest Port 1 View  Result Date: 03/07/2019 CLINICAL DATA:  Weakness and fatigue EXAM: PORTABLE CHEST 1 VIEW COMPARISON:  None. FINDINGS: No consolidation or effusion. Possible subtle ground-glass opacity in the left mid lung. Mild bronchitic changes. Normal heart size. Aortic atherosclerosis. No pneumothorax. IMPRESSION: Possible subtle ground-glass and interstitial opacity in the left mid lung, questionable for mild pneumonia. Electronically Signed   By: Donavan Foil M.D.   On: 03/07/2019 22:46    Micro Results    Recent Results (from the past 240 hour(s))  Blood Culture (routine x 2)     Status: None (Preliminary result)   Collection Time: 03/08/19  1:06 AM   Specimen: BLOOD LEFT FOREARM  Result Value Ref Range Status   Specimen Description BLOOD LEFT FOREARM  Final   Special Requests   Final    BOTTLES DRAWN AEROBIC AND ANAEROBIC Blood Culture results may not be optimal due to an inadequate volume of blood received in culture  bottles   Culture   Final    NO GROWTH 4 DAYS Performed at [facility], [place] [address] [place], [place] [address]    Report Status PENDING  Incomplete  Blood Culture (routine x 2)     Status: None (Preliminary result)   Collection Time: 03/08/19  1:06 AM   Specimen: BLOOD  Result Value Ref Range Status   Specimen Description BLOOD RIGHT ANTECUBITAL  Final   Special Requests   Final    BOTTLES DRAWN AEROBIC AND ANAEROBIC Blood Culture results may not be optimal due to an inadequate volume of blood received in culture bottles   Culture   Final    NO GROWTH 4 DAYS Performed at [facility], [place] [address] [place], [place] [address]    Report Status PENDING  Incomplete    Today   Subjective    Aroura Vasudevan today has no headache,no chest abdominal pain,no new weakness tingling or numbness .   Objective   Blood pressure 120/79, pulse 83, temperature 98 F (36.7 C), temperature source Axillary, resp. rate (!) 25, height 5' 5"  (1.651 m), weight 48.1 kg, SpO2 92 %.   Intake/Output Summary (Last 24 hours) at 03/12/2019 0943 Last data filed at 03/11/2019 1302 Gross per 24 hour  Intake 220 ml  Output 400 ml  Net -180 ml    Exam  Awake sitting up in chair pleasantly confused, no focal deficits [employer].AT,PERRAL Supple Neck,No JVD, No cervical lymphadenopathy appriciated.  Symmetrical Chest wall movement, Good air movement bilaterally, CTAB RRR,No Gallops,Rubs or new Murmurs, No Parasternal Heave +ve B.Sounds, Abd Soft, Non tender, No organomegaly appriciated, No rebound -guarding or rigidity. No Cyanosis, Clubbing or edema, No new Rash or bruise   Data Review   CBC w Diff:  Lab Results  Component Value Date   WBC 9.4 03/11/2019   HGB 16.0 (H) 03/11/2019   HCT 48.7 (H) 03/11/2019   PLT  258 03/11/2019   LYMPHOPCT 9 03/08/2019   MONOPCT 6 03/08/2019   EOSPCT 0 03/08/2019   BASOPCT 0 03/08/2019    CMP:  Lab Results  Component Value Date   NA 141 03/11/2019    K 3.9 03/11/2019   CL 106 03/11/2019   CO2 21 (L) 03/11/2019   BUN 25 (H) 03/11/2019   CREATININE 0.83 03/11/2019   PROT 6.6 03/11/2019   ALBUMIN 2.8 (L) 03/11/2019   BILITOT 0.9 03/11/2019   ALKPHOS 47 03/11/2019   AST 38 03/11/2019   ALT 24 03/11/2019  .   Total Time in preparing paper work, data evaluation and todays exam - 57 minutes  Lala Lund M.D on 03/12/2019 at 9:43 AM  [facility]   Office  [phone]

## 2019-03-12 NOTE — Progress Notes (Signed)
Report called to [facility] at [facility].  [phone].

## 2019-03-12 NOTE — Social Work (Signed)
Clinical Social Worker facilitated patient discharge including contacting patient family and facility to confirm patient discharge plans.  Clinical information faxed to facility and family agreeable with plan.  CSW arranged ambulance transport via [facility] to [facility] RN to call [phone]  with report prior to discharge.  Clinical Social Worker will sign off for now as social work intervention is no longer needed. Please consult us again if new need arises.  Shanikia Kernodle, MSW, LCSW Clinical Social Worker   

## 2019-03-12 NOTE — Discharge Instructions (Signed)
Follow with Primary MD in 7 days   Get CBC, CMP, 2 view Chest X ray -  checked next visit within 1 week by Primary MD or SNF MD  Activity: As tolerated with Full fall precautions use walker/cane & assistance as needed  Disposition SNF  Diet: Heart Healthy    Special Instructions: If you have smoked or chewed Tobacco  in the last 2 yrs please stop smoking, stop any regular Alcohol  and or any Recreational drug use.  On your next visit with your primary care physician please Get Medicines reviewed and adjusted.  Please request your Prim.MD to go over all Hospital Tests and Procedure/Radiological results at the follow up, please get all Hospital records sent to your Prim MD by signing hospital release before you go home.  If you experience worsening of your admission symptoms, develop shortness of breath, life threatening emergency, suicidal or homicidal thoughts you must seek medical attention immediately by calling 911 or calling your MD immediately  if symptoms less severe.  You Must read complete instructions/literature along with all the possible adverse reactions/side effects for all the Medicines you take and that have been prescribed to you. Take any new Medicines after you have completely understood and accpet all the possible adverse reactions/side effects.         

## 2019-03-12 NOTE — Progress Notes (Signed)
Patient picked up by [facility] for transport to [facility].  Pt left with belongings.  Skin intact.  IV d/c'd.

## 2019-03-13 LAB — CULTURE, BLOOD (ROUTINE X 2)
Culture: NO GROWTH
Culture: NO GROWTH

## 2020-09-11 IMAGING — CT CT HEAD W/O CM
3 series · 16 of 47 positions shown, 19 images · non-contrast
Comparison: None.

CLINICAL DATA: Change in mental status

EXAM:
CT HEAD WITHOUT CONTRAST
TECHNIQUE: Contiguous axial images were obtained from the base of the skull
through the vertex without intravenous contrast.

[Series 3: head 5.0 h30s · axial · 0.40mm/px · z∈[+1376,+1521]mm · 10 of 35 slices shown, 13 images]
[im 3/35  brain]
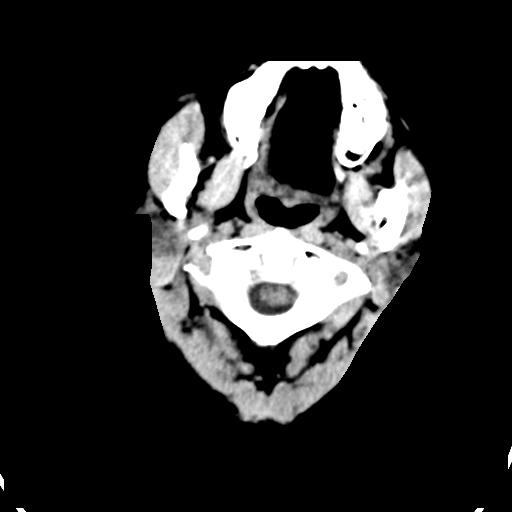
[im 3/35  bone]
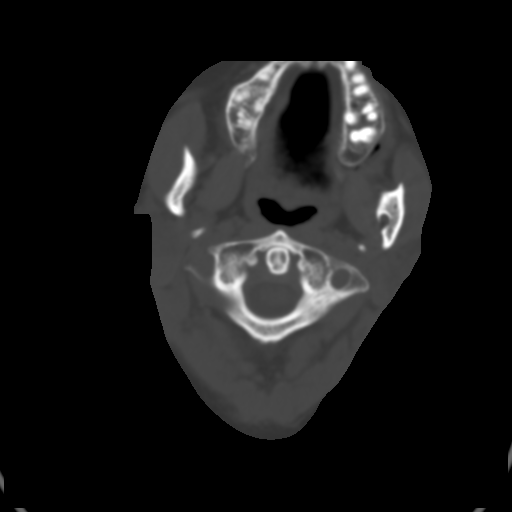
[im 6/35  brain]
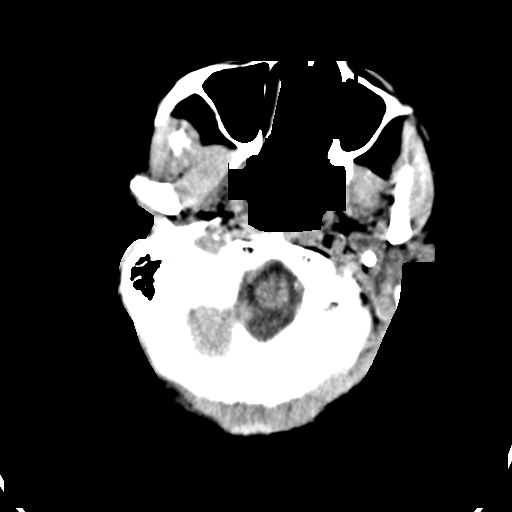
[im 10/35  brain]
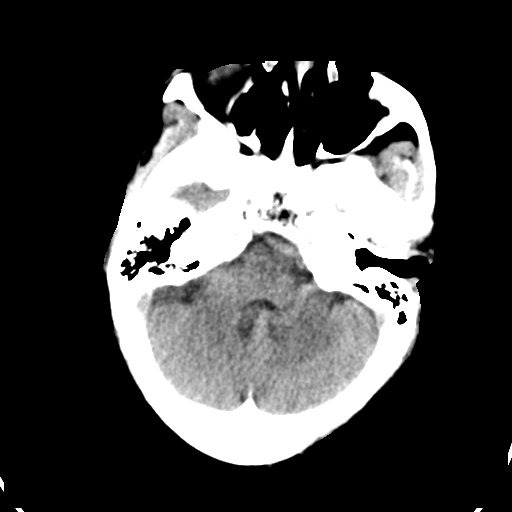
[im 12/35  brain]
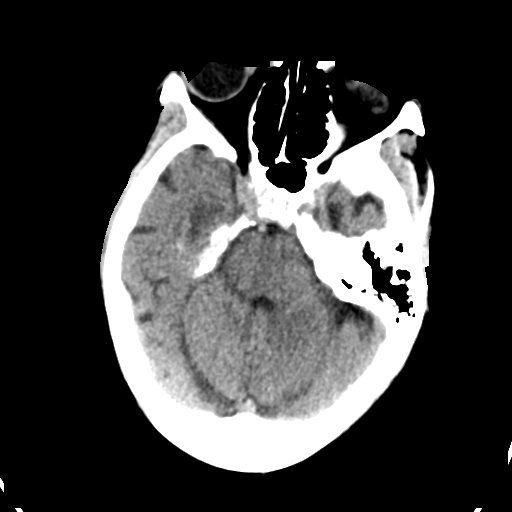
[im 16/35  brain]
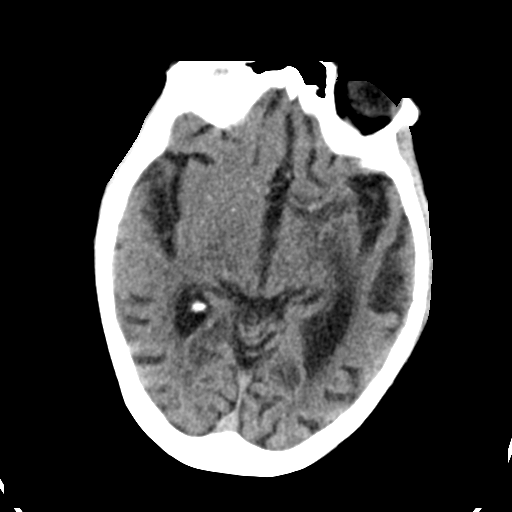
[im 16/35  bone]
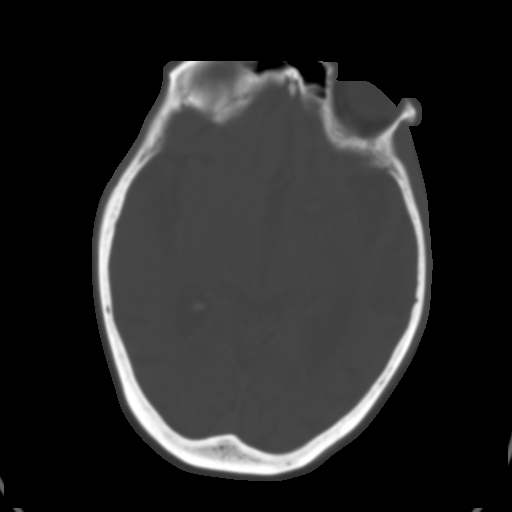
[im 19/35  brain]
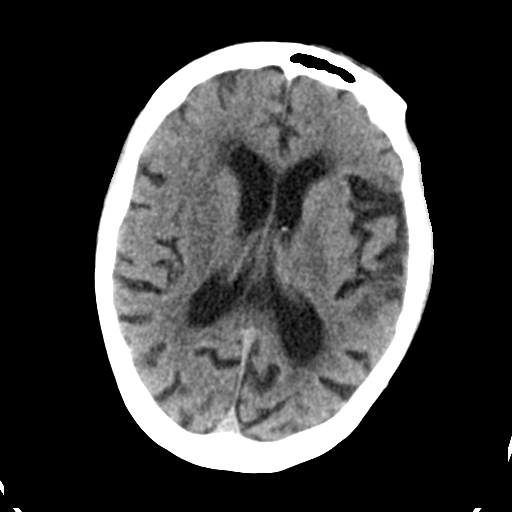
[im 23/35  brain]
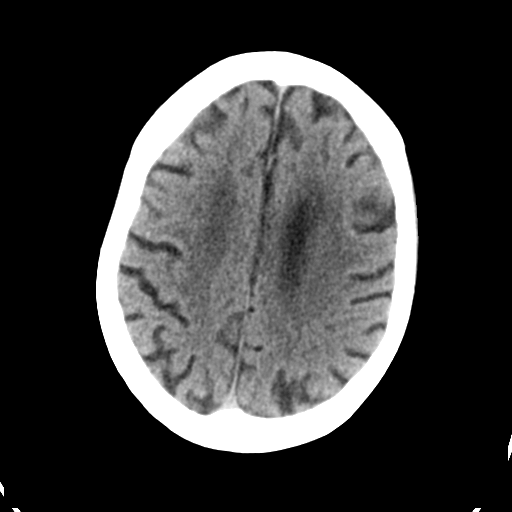
[im 26/35  brain]
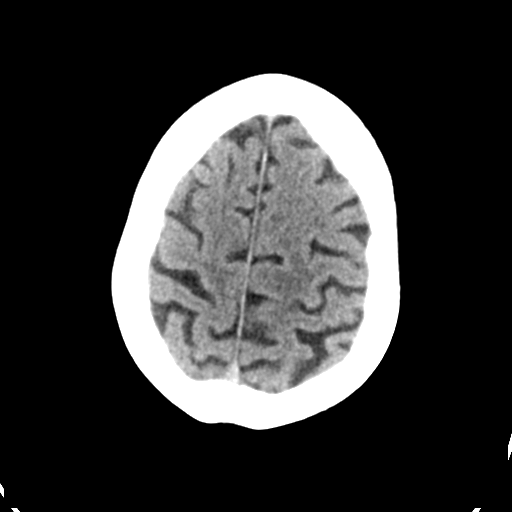
[im 29/35  brain]
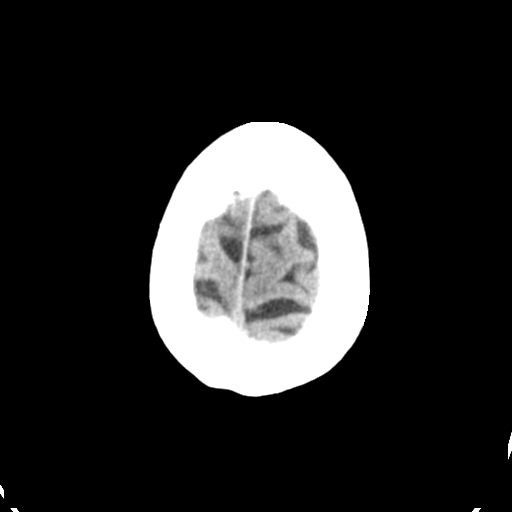
[im 29/35  bone]
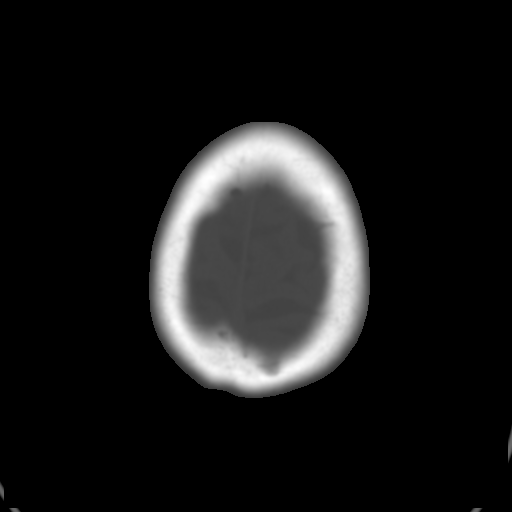
[im 32/35  brain]
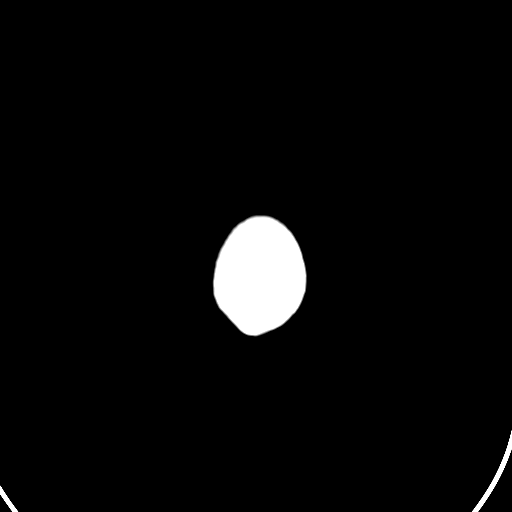

[Series 5: head 3.0 mpr cor · coronal · 0.31mm/px · 3 of 67 slices shown]
[im 23/67  brain]
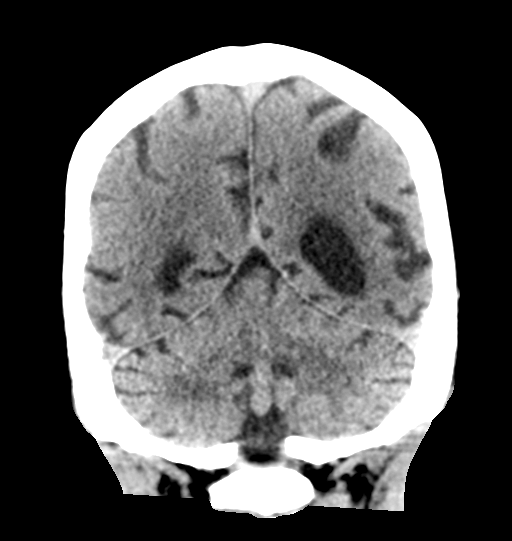
[im 30/67  brain]
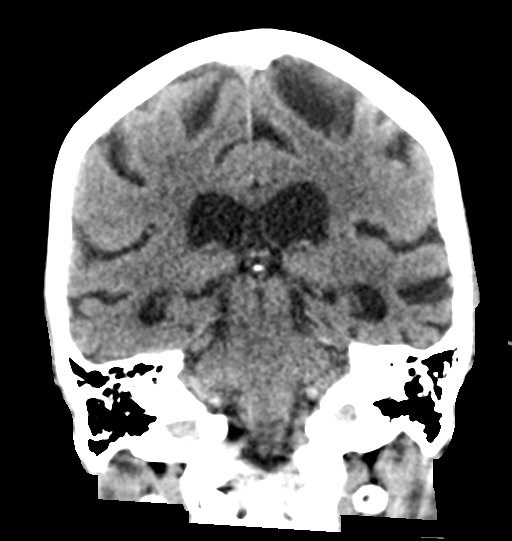
[im 37/67  brain]
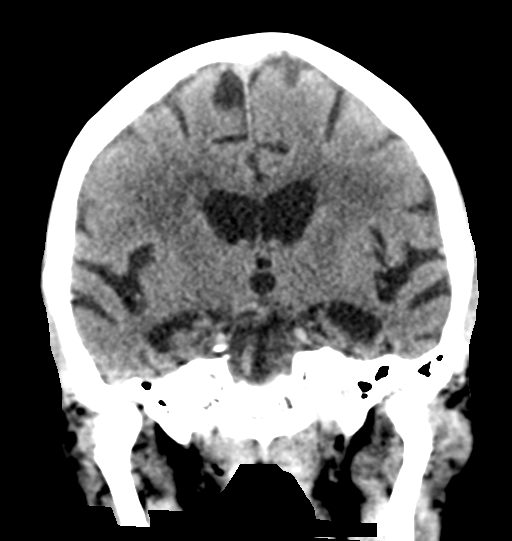

[Series 6: head 3.0 mpr sag · sagittal · 0.31mm/px · 3 of 52 slices shown]
[im 18/52  brain]
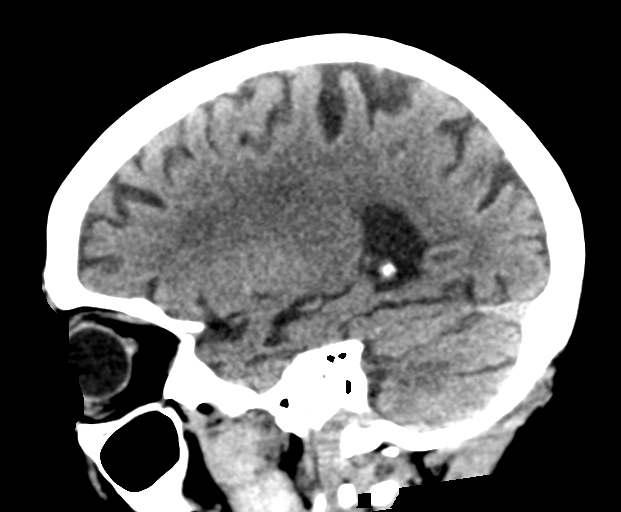
[im 26/52  brain]
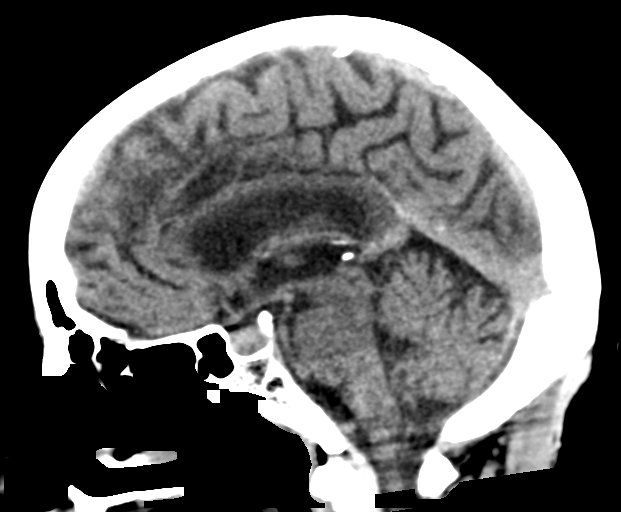
[im 35/52  brain]
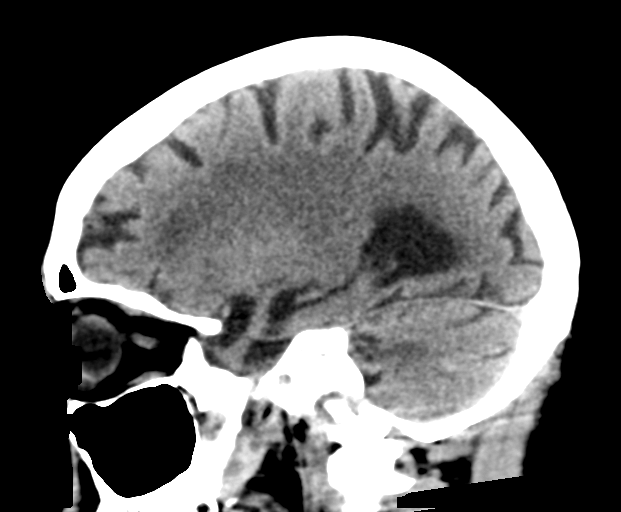

[16 of 47 positions shown; findings below may reference images not displayed]

FINDINGS: Brain: No evidence of acute territorial infarction, hemorrhage,
hydrocephalus,extra-axial collection or mass lesion/mass effect.
There is dilatation the ventricles and sulci consistent with
age-related atrophy. Low-attenuation changes in the deep white
matter consistent with small vessel ischemia.

Vascular: No hyperdense vessel or unexpected calcification.

Skull: The skull is intact. No fracture or focal lesion identified.

Sinuses/Orbits: The visualized paranasal sinuses and mastoid air
cells are clear. The orbits and globes intact.

Other: None
IMPRESSION: No acute intracranial abnormality.

Findings consistent with age related atrophy and chronic small
vessel ischemia

## 2021-09-11 ENCOUNTER — Encounter (HOSPITAL_COMMUNITY): Payer: Self-pay

## 2021-09-11 ENCOUNTER — Emergency Department (HOSPITAL_COMMUNITY): Payer: Medicare Other

## 2021-09-11 ENCOUNTER — Emergency Department (HOSPITAL_COMMUNITY)
Admission: EM | Admit: 2021-09-11 | Discharge: 2021-09-12 | Disposition: A | Discharge status: Skilled Nursing Facility | Payer: Medicare Other | Attending: Emergency Medicine | Admitting: Emergency Medicine

## 2021-09-11 DIAGNOSIS — Z20822 Contact with and (suspected) exposure to covid-19: Secondary | ICD-10-CM | POA: Insufficient documentation

## 2021-09-11 DIAGNOSIS — R509 Fever, unspecified: Secondary | ICD-10-CM | POA: Diagnosis present

## 2021-09-11 DIAGNOSIS — D72829 Elevated white blood cell count, unspecified: Secondary | ICD-10-CM | POA: Insufficient documentation

## 2021-09-11 DIAGNOSIS — F0390 Unspecified dementia without behavioral disturbance: Secondary | ICD-10-CM | POA: Insufficient documentation

## 2021-09-11 DIAGNOSIS — R112 Nausea with vomiting, unspecified: Secondary | ICD-10-CM | POA: Diagnosis not present

## 2021-09-11 DIAGNOSIS — R531 Weakness: Secondary | ICD-10-CM | POA: Insufficient documentation

## 2021-09-11 LAB — URINALYSIS, ROUTINE W REFLEX MICROSCOPIC
Bilirubin Urine: NEGATIVE
Glucose, UA: NEGATIVE mg/dL
Hgb urine dipstick: NEGATIVE
Ketones, ur: NEGATIVE mg/dL
Nitrite: NEGATIVE
Protein, ur: 30 mg/dL — AB
Specific Gravity, Urine: 1.024 (ref 1.005–1.030)
pH: 5 (ref 5.0–8.0)

## 2021-09-11 LAB — COMPREHENSIVE METABOLIC PANEL
ALT: 19 U/L (ref 0–44)
AST: 24 U/L (ref 15–41)
Albumin: 4.1 g/dL (ref 3.5–5.0)
Alkaline Phosphatase: 85 U/L (ref 38–126)
Anion gap: 8 (ref 5–15)
BUN: 23 mg/dL (ref 8–23)
CO2: 26 mmol/L (ref 22–32)
Calcium: 9.1 mg/dL (ref 8.9–10.3)
Chloride: 106 mmol/L (ref 98–111)
Creatinine, Ser: 1.15 mg/dL — ABNORMAL HIGH (ref 0.44–1.00)
GFR, Estimated: 50 mL/min — ABNORMAL LOW (ref >60)
Glucose, Bld: 136 mg/dL — ABNORMAL HIGH (ref 70–99)
Potassium: 4.3 mmol/L (ref 3.5–5.1)
Sodium: 140 mmol/L (ref 135–145)
Total Bilirubin: 0.6 mg/dL (ref 0.3–1.2)
Total Protein: 7.3 g/dL (ref 6.5–8.1)

## 2021-09-11 LAB — CBC WITH DIFFERENTIAL/PLATELET
Abs Immature Granulocytes: 0.06 10*3/uL (ref 0.00–0.07)
Basophils Absolute: 0 10*3/uL (ref 0.0–0.1)
Basophils Relative: 0 %
Eosinophils Absolute: 0 10*3/uL (ref 0.0–0.5)
Eosinophils Relative: 0 %
HCT: 42.4 % (ref 36.0–46.0)
Hemoglobin: 14 g/dL (ref 12.0–15.0)
Immature Granulocytes: 0 %
Lymphocytes Relative: 4 %
Lymphs Abs: 0.5 10*3/uL — ABNORMAL LOW (ref 0.7–4.0)
MCH: 27.9 pg (ref 26.0–34.0)
MCHC: 33 g/dL (ref 30.0–36.0)
MCV: 84.5 fL (ref 80.0–100.0)
Monocytes Absolute: 1 10*3/uL (ref 0.1–1.0)
Monocytes Relative: 7 %
Neutro Abs: 11.8 10*3/uL — ABNORMAL HIGH (ref 1.7–7.7)
Neutrophils Relative %: 89 %
Platelets: 231 10*3/uL (ref 150–400)
RBC: 5.02 MIL/uL (ref 3.87–5.11)
RDW: 14.6 % (ref 11.5–15.5)
WBC: 13.4 10*3/uL — ABNORMAL HIGH (ref 4.0–10.5)
nRBC: 0 % (ref 0.0–0.2)

## 2021-09-11 LAB — LACTIC ACID, PLASMA: Lactic Acid, Venous: 1.6 mmol/L (ref 0.5–1.9)

## 2021-09-11 LAB — SARS CORONAVIRUS 2 BY RT PCR: SARS Coronavirus 2 by RT PCR: NEGATIVE

## 2021-09-11 LAB — CBG MONITORING, ED: Glucose-Capillary: 148 mg/dL — ABNORMAL HIGH (ref 70–99)

## 2021-09-11 LAB — LIPASE, BLOOD: Lipase: 38 U/L (ref 11–51)

## 2021-09-11 LAB — ETHANOL: Alcohol, Ethyl (B): <10 mg/dL (ref <10)

## 2021-09-11 MED ORDER — SODIUM CHLORIDE 0.9 % IV BOLUS
1000.0000 mL | Freq: Once | INTRAVENOUS | Status: AC
  Administered 2021-09-11: 1000 mL via INTRAVENOUS

## 2021-09-11 MED ORDER — IRBESARTAN 300 MG PO TABS: 150.0000 mg | ORAL_TABLET | Freq: Every day | ORAL | Status: DC

## 2021-09-11 MED ORDER — ACETAMINOPHEN 325 MG PO TABS
650.0000 mg | ORAL_TABLET | Freq: Four times a day (QID) | ORAL | Status: DC | PRN
  Administered 2021-09-11: 650 mg via ORAL
  Filled 2021-09-11: qty 2

## 2021-09-11 MED ORDER — MEMANTINE HCL ER 28 MG PO CP24: 28.0000 mg | ORAL_CAPSULE | Freq: Every day | ORAL | Status: DC

## 2021-09-11 MED ORDER — ONDANSETRON 4 MG PO TBDP: 4.0000 mg | ORAL_TABLET | Freq: Three times a day (TID) | ORAL | 0 refills | Status: AC | PRN

## 2021-09-11 MED ORDER — LEVOCETIRIZINE DIHYDROCHLORIDE 2.5 MG/5ML PO SOLN: 2.5000 mg | Freq: Every evening | ORAL | Status: DC

## 2021-09-11 NOTE — Discharge Instructions (Addendum)
As discussed, today's evaluation has been generally reassuring and there is some suspicion for a viral process contributing to your nausea, vomiting.  Use Tylenol, ibuprofen and the prescribed Zofran for nausea control and symptom management.  Return here for concerning changes or follow-up with your physician.

## 2021-09-11 NOTE — ED Provider Notes (Addendum)
[facility] EMERGENCY DEPARTMENT Provider Note   CSN: [id number] Arrival date & time: 09/11/21  1617     History  Chief Complaint  Patient presents with   Weakness    Vomiting    Yesenia Copeland is a 76 y.o. female.  HPI Patient presents with nausea, vomiting, now resolved.  Patient has confusion at baseline, and currently denies any recollection of why she is here.  Reportedly the patient was vomiting at the nursing facility and was sent here.  The patient cannot provide any details of this, has cognitive limitations, level 5 caveat.  She does seemingly describe her current condition and accurately, denying pain, denying dyspnea, denying nausea. Home Medications Prior to Admission medications   Medication Sig Start Date End Date Taking? Authorizing Provider  ondansetron (ZOFRAN-ODT) 4 MG disintegrating tablet Take 1 tablet (4 mg total) by mouth every 8 (eight) hours as needed for nausea or vomiting. 09/11/21  Yes Gerhard Munch, MD  levocetirizine (XYZAL) 2.5 MG/5ML solution Take 2.5 mg by mouth every evening.    [provider]  memantine (NAMENDA XR) 28 MG CP24 24 hr capsule Take 28 mg by mouth daily.    [provider]  valsartan (DIOVAN) 160 MG tablet Take 160 mg by mouth daily.    [provider]      Allergies    Patient has no known allergies.    Review of Systems   Review of Systems  Unable to perform ROS: Dementia    Physical Exam Updated Vital Signs BP 126/67 (BP Location: Right Arm)   Pulse 71   Temp 97.8 F (36.6 C) (Oral)   Resp 16   SpO2 99%  Physical Exam Vitals and nursing note reviewed.  Constitutional:      General: She is not in acute distress.    Appearance: She is well-developed.  HENT:     Head: Normocephalic and atraumatic.  Eyes:     Conjunctiva/sclera: Conjunctivae normal.  Cardiovascular:     Rate and Rhythm: Normal rate and regular rhythm.  Pulmonary:     Effort: Pulmonary effort is  normal. No respiratory distress.     Breath sounds: Normal breath sounds. No stridor.  Abdominal:     General: There is no distension.     Tenderness: There is no abdominal tenderness. There is no guarding.  Skin:    General: Skin is warm and dry.  Neurological:     Mental Status: She is alert and oriented to person, place, and time.     Cranial Nerves: No cranial nerve deficit.  Psychiatric:        Mood and Affect: Mood normal.        Cognition and Memory: Cognition is impaired. Memory is impaired.     ED Results / Procedures / Treatments   Labs (all labs ordered are listed, but only abnormal results are displayed) Labs Reviewed  COMPREHENSIVE METABOLIC PANEL - Abnormal; Notable for the following components:      Result Value   Glucose, Bld 136 (*)    Creatinine, Ser 1.15 (*)    GFR, Estimated 50 (*)    All other components within normal limits  CBC WITH DIFFERENTIAL/PLATELET - Abnormal; Notable for the following components:   WBC 13.4 (*)    Neutro Abs 11.8 (*)    Lymphs Abs 0.5 (*)    All other components within normal limits  URINALYSIS, ROUTINE W REFLEX MICROSCOPIC - Abnormal; Notable for the following components:   APPearance  HAZY (*)    Protein, ur 30 (*)    Leukocytes,Ua TRACE (*)    Bacteria, UA MANY (*)    All other components within normal limits  CBG MONITORING, ED - Abnormal; Notable for the following components:   Glucose-Capillary 148 (*)    All other components within normal limits  SARS CORONAVIRUS 2 BY RT PCR  ETHANOL  LIPASE, BLOOD  LACTIC ACID, PLASMA  LACTIC ACID, PLASMA    EKG None  Radiology DG Chest Port 1 View  Result Date: 09/11/2021 CLINICAL DATA:  Fever EXAM: PORTABLE CHEST 1 VIEW COMPARISON:  None Available. FINDINGS: The heart size and mediastinal contours are within normal limits. Both lungs are clear. The visualized skeletal structures are unremarkable. IMPRESSION: No active disease. Electronically Signed   By: Helyn Numbers M.D.    On: 09/11/2021 23:27    Procedures Procedures    Medications Ordered in ED Medications  acetaminophen (TYLENOL) tablet 650 mg (650 mg Oral Given 09/11/21 2036)  sodium chloride 0.9 % bolus 1,000 mL (1,000 mLs Intravenous New Bag/Given 09/11/21 2046)    ED Course/ Medical Decision Making/ A&P This patient with a Hx of dementia presents to the ED for concern of nausea, vomiting, per nursing report patient denies this or any other complaints, this involves an extensive number of treatment options, and is a complaint that carries with it a high risk of complications and morbidity.    The differential diagnosis includes viral illness, pneumonia, dehydration, bacteremia, sepsis, COVID, urinary tract infection   Social Determinants of Health:  Dementia, nursing home residency  Additional history obtained:  Additional history and/or information obtained from chart review and nursing home notes, notable for chart review notable for evaluation 2 years ago here after arriving from [place], admitted for COVID   After the initial evaluation, orders, including: Labs x-ray urinalysis were initiated.   Patient placed on Cardiac and Pulse-Oximetry Monitors. The patient was maintained on a cardiac monitor.  The cardiac monitored showed an rhythm of 70 sinus normal The patient was also maintained on pulse oximetry. The readings were typically 100% room air normal   On repeat evaluation of the patient stayed the same  Several repeat evaluations the patient remains essentially the same, smiling.  At one point she did have a fever, this improved with Tylenol she continued to deny any complaints   Lab Tests:  I personally interpreted labs.  The pertinent results include: Trace leukocytosis, leukocyte positive nitrite negative urine creatinine 1.15  Imaging Studies ordered:  I independently visualized and interpreted imaging which showed no pneumonia on chest x-ray I agree with the radiologist  interpretation   Dispostion / Final MDM:  After consideration of the diagnostic results and the patient's response to treatment, female with dementia presents from nursing facility after episode of vomiting that occurred prior to ED arrival.  Here she is pleasant, monitored for hours without any decompensation.  She did have fever, and with mild leukocytosis there are some suspicion for viral process, but the patient is COVID-negative.  UA w trace nitrates; patient received Fosfomycin.  She has a soft, nontender abdomen, no indication for abdominal CT, or other advanced imaging.  Patient appropriate to return to monitored facility, with ongoing Tylenol, Zofran for symptom management, return as needed.  Final Clinical Impression(s) / ED Diagnoses Final diagnoses:  Fever in adult    Rx / DC Orders ED Discharge Orders          Ordered    ondansetron (ZOFRAN-ODT)  4 MG disintegrating tablet  Every 8 hours PRN        09/11/21 2337              Gerhard Munch, MD 09/11/21 2337    Gerhard Munch, MD 09/12/21 0002

## 2021-09-11 NOTE — ED Triage Notes (Signed)
Pt received via EMS to room 21. EMS reports pt having weakness and vomiting. Pt confused at baseline.

## 2021-09-11 NOTE — ED Notes (Signed)
Call placed to [facility] for transport to [facility]

## 2021-09-12 MED ORDER — FOSFOMYCIN TROMETHAMINE 3 G PO PACK
3.0000 g | PACK | Freq: Once | ORAL | Status: DC
  Filled 2021-09-12: qty 3
